# Patient Record
Sex: Male | Born: 1945 | Race: White | Hispanic: No | Marital: Married | Smoking: Current every day smoker
Health system: Southern US, Community
[De-identification: ages and names within clinical notes are randomized; demographics above are authoritative.]

## PROBLEM LIST (undated history)

## (undated) DIAGNOSIS — I739 Peripheral vascular disease, unspecified: Secondary | ICD-10-CM

## (undated) DIAGNOSIS — G35 Multiple sclerosis: Secondary | ICD-10-CM

## (undated) HISTORY — DX: Peripheral vascular disease, unspecified: I73.9

## (undated) HISTORY — PX: TONSILLECTOMY: SUR1361

## (undated) HISTORY — DX: Multiple sclerosis: G35

## (undated) HISTORY — PX: FEMORAL ARTERY - FEMORAL ARTERY BYPASS GRAFT: SUR179

---

## 1982-12-12 HISTORY — PX: LAMINECTOMY: SHX219

## 1983-12-13 DIAGNOSIS — G35 Multiple sclerosis: Secondary | ICD-10-CM

## 1983-12-13 HISTORY — DX: Multiple sclerosis: G35

## 2006-03-14 ENCOUNTER — Encounter: Payer: Self-pay | Admitting: Orthopedic Surgery

## 2006-04-11 ENCOUNTER — Encounter: Payer: Self-pay | Admitting: Orthopedic Surgery

## 2012-05-03 ENCOUNTER — Encounter: Payer: Self-pay | Admitting: Internal Medicine

## 2012-05-03 ENCOUNTER — Ambulatory Visit (INDEPENDENT_AMBULATORY_CARE_PROVIDER_SITE_OTHER): Payer: Medicare Other | Admitting: Internal Medicine

## 2012-05-03 VITALS — BP 116/76 | HR 91 | Temp 97.6°F | Resp 16 | Ht 67.0 in | Wt 215.0 lb

## 2012-05-03 DIAGNOSIS — E119 Type 2 diabetes mellitus without complications: Secondary | ICD-10-CM

## 2012-05-03 DIAGNOSIS — L89509 Pressure ulcer of unspecified ankle, unspecified stage: Secondary | ICD-10-CM

## 2012-05-03 DIAGNOSIS — G35D Multiple sclerosis, unspecified: Secondary | ICD-10-CM

## 2012-05-03 DIAGNOSIS — L8990 Pressure ulcer of unspecified site, unspecified stage: Secondary | ICD-10-CM

## 2012-05-03 DIAGNOSIS — I739 Peripheral vascular disease, unspecified: Secondary | ICD-10-CM | POA: Insufficient documentation

## 2012-05-03 DIAGNOSIS — G35 Multiple sclerosis: Secondary | ICD-10-CM | POA: Insufficient documentation

## 2012-05-03 DIAGNOSIS — E1169 Type 2 diabetes mellitus with other specified complication: Secondary | ICD-10-CM | POA: Insufficient documentation

## 2012-05-03 DIAGNOSIS — D649 Anemia, unspecified: Secondary | ICD-10-CM

## 2012-05-03 DIAGNOSIS — I779 Disorder of arteries and arterioles, unspecified: Secondary | ICD-10-CM

## 2012-05-03 NOTE — Assessment & Plan Note (Signed)
Blood sugars appear to be well-controlled with last A1c of 7%. Will repeat A1c here. Will get records on previous evaluation and management. Encouraged him to be compliant with his insulin. Followup 3 months.

## 2012-05-03 NOTE — Assessment & Plan Note (Signed)
History of fem-fem bypass. Pedal pulses are not palpable on the left. Distal lower extremity is cool to touch. Patient reports this is chronic. Patient has vascular evaluation scheduled for next week.

## 2012-05-03 NOTE — Assessment & Plan Note (Signed)
Secondary to PAT, diabetes. As above, patient has vascular evaluation set up for next week. We will also set up evaluation at local wound healing Center.

## 2012-05-03 NOTE — Assessment & Plan Note (Signed)
Significant limitations in mobility secondary to MS and right shoulder injury. Will get records on previous evaluation and management.

## 2012-05-03 NOTE — Assessment & Plan Note (Signed)
Unable to visualize today because patient needs max assistance to get out of the wheelchair and no lift available. Will set up evaluation at the wound healing Center. We discussed that it will be difficult for his wound to heal given that he is unable to rotate and spends the majority of his time lying on his back.

## 2012-05-03 NOTE — Progress Notes (Signed)
Subjective:    Patient ID: Shane [name], male    DOB: 1946-06-05, 66 y.o.   MRN: [id number]  HPI 66 year old male with history of multiple sclerosis, peripheral vascular disease, diabetes presents to establish care. His primary concern today are 2 wounds, one on his back and one on his left lateral ankle. He reports significant decrease in mobility after a right shoulder injury. He is now bound to a wheelchair and requires assistance for all activities of daily living. Because of this, and because he is unable to spend time lying on his side or stomach because of a shoulder injury, he has developed a decubitus ulcer and a pressure sore on his left heel. He is in the process of being evaluated by vascular surgery to ensure that vascular supply to his left lower extremity is intact. He is status post fem-fem bypass. In regards to his diabetes, his wife reports slight increase in his A1c recently 7%. He is not always compliant with his blood sugar monitoring and insulin use. However, he finds the regimen with Novolin 7030 most ideal for him. He has use Lantus in the past with poor control of his blood sugars.  He is followed at the [facility] by a primary care physician every 6 months and also at [facility] by neurology and vascular surgery.  Outpatient Encounter Prescriptions as of 05/03/2012  Medication Sig Dispense Refill  . amantadine (SYMMETREL) 100 MG capsule Take 100 mg by mouth 2 (two) times daily.      Marland Kitchen amLODipine (NORVASC) 10 MG tablet Take 10 mg by mouth daily.      . ASPIRIN PO Take by mouth daily.      . baclofen (LIORESAL) 10 MG tablet Take 20 mg by mouth 3 (three) times daily.      . cloNIDine (CATAPRES) 0.1 MG tablet Take 0.1 mg by mouth 2 (two) times daily.      . diazepam (VALIUM) 5 MG tablet Take 5 mg by mouth 2 (two) times daily.      Marland Kitchen gabapentin (NEURONTIN) 300 MG capsule Take 300 mg by mouth 4 (four) times daily.      . hydrochlorothiazide (MICROZIDE) 12.5 MG capsule  Take 12.5 mg by mouth daily.      . insulin NPH-insulin regular (NOVOLIN 70/30) (70-30) 100 UNIT/ML injection Inject into the skin as directed. Inject subcutaneously 18 units every morning & 20 units every night.      Marland Kitchen lisinopril (PRINIVIL,ZESTRIL) 10 MG tablet Take 10 mg by mouth daily.      . metFORMIN (GLUCOPHAGE) 1000 MG tablet Take 1,000 mg by mouth 2 (two) times daily with a meal.      . methotrexate (RHEUMATREX) 2.5 MG tablet Take 7.5 mg by mouth once a week.      . Multiple Vitamin (MULTIVITAMIN) tablet Take 1 tablet by mouth daily.      . naproxen (NAPROSYN) 250 MG tablet Take 250 mg by mouth 2 (two) times daily with a meal.      . omeprazole (PRILOSEC) 20 MG capsule Take 20 mg by mouth daily.      . pentoxifylline (TRENTAL) 400 MG CR tablet Take 400 mg by mouth 2 (two) times daily.      . pravastatin (PRAVACHOL) 80 MG tablet Take 40 mg by mouth daily.        Review of Systems  Constitutional: Negative for fever, chills, activity change, appetite change, fatigue and unexpected weight change.  Eyes: Negative for visual disturbance.  Respiratory: Negative for cough and shortness of breath.   Cardiovascular: Negative for chest pain, palpitations and leg swelling.  Gastrointestinal: Positive for constipation (takes immodium to regulate BM). Negative for abdominal pain, blood in stool, abdominal distention, anal bleeding and rectal pain.  Genitourinary: Negative for dysuria, urgency and difficulty urinating.  Musculoskeletal: Positive for myalgias, joint swelling, arthralgias and gait problem.  Skin: Positive for color change and wound. Negative for rash.  Neurological: Positive for weakness and numbness.  Hematological: Negative for adenopathy.  Psychiatric/Behavioral: Negative for sleep disturbance and dysphoric mood. The patient is not nervous/anxious.    BP 116/76  Pulse 91  Temp(Src) 97.6 F (36.4 C) (Oral)  Resp 16  Ht 5\' 7"  (1.702 m)  Wt 215 lb (97.523 kg)  BMI 33.67 kg/m2   SpO2 97%     Objective:   Physical Exam  Constitutional: He is oriented to person, place, and time. He appears well-developed and well-nourished. No distress.  HENT:  Head: Normocephalic and atraumatic.  Right Ear: External ear normal.  Left Ear: External ear normal.  Nose: Nose normal.  Mouth/Throat: Oropharynx is clear and moist. No oropharyngeal exudate.  Eyes: Conjunctivae and EOM are normal. Pupils are equal, round, and reactive to light. Right eye exhibits no discharge. Left eye exhibits no discharge. No scleral icterus.  Neck: Normal range of motion. Neck supple. No tracheal deviation present. No thyromegaly present.  Cardiovascular: Normal rate, regular rhythm and normal heart sounds.  Exam reveals no gallop and no friction rub.   No murmur heard. Pulmonary/Chest: Effort normal and breath sounds normal. No accessory muscle usage. Not tachypneic. No respiratory distress. He has no decreased breath sounds. He has no wheezes. He has no rhonchi. He has no rales. He exhibits no tenderness.  Musculoskeletal: He exhibits edema.       Right shoulder: He exhibits decreased range of motion, deformity and decreased strength. He exhibits no tenderness, no bony tenderness, no swelling and no pain.  Lymphadenopathy:    He has no cervical adenopathy.  Neurological: He is alert and oriented to person, place, and time. No cranial nerve deficit. He exhibits abnormal muscle tone. Coordination abnormal.  Skin: Skin is warm and dry. Lesion noted. No rash noted. He is not diaphoretic. There is erythema. No pallor.     Psychiatric: He has a normal mood and affect. His behavior is normal. Judgment and thought content normal.          Assessment & Plan:

## 2012-05-11 ENCOUNTER — Encounter: Payer: Self-pay | Admitting: Nurse Practitioner

## 2012-05-11 ENCOUNTER — Encounter: Payer: Self-pay | Admitting: Cardiothoracic Surgery

## 2012-05-22 ENCOUNTER — Other Ambulatory Visit (INDEPENDENT_AMBULATORY_CARE_PROVIDER_SITE_OTHER): Payer: Medicare Other | Admitting: *Deleted

## 2012-05-22 DIAGNOSIS — E119 Type 2 diabetes mellitus without complications: Secondary | ICD-10-CM

## 2012-05-22 DIAGNOSIS — N390 Urinary tract infection, site not specified: Secondary | ICD-10-CM

## 2012-05-22 DIAGNOSIS — D649 Anemia, unspecified: Secondary | ICD-10-CM

## 2012-05-23 ENCOUNTER — Telehealth: Payer: Self-pay | Admitting: Internal Medicine

## 2012-05-23 LAB — CBC WITH DIFFERENTIAL/PLATELET
Basophils Absolute: 0 10*3/uL (ref 0.0–0.1)
Eosinophils Absolute: 0.5 10*3/uL (ref 0.0–0.7)
Hemoglobin: 15.6 g/dL (ref 13.0–17.0)
Lymphocytes Relative: 9.7 % — ABNORMAL LOW (ref 12.0–46.0)
MCHC: 32.3 g/dL (ref 30.0–36.0)
Monocytes Absolute: 0.7 10*3/uL (ref 0.1–1.0)
Neutro Abs: 9.6 10*3/uL — ABNORMAL HIGH (ref 1.4–7.7)
RDW: 18.2 % — ABNORMAL HIGH (ref 11.5–14.6)

## 2012-05-23 LAB — POCT URINALYSIS DIPSTICK
Bilirubin, UA: NEGATIVE
Glucose, UA: NEGATIVE
Ketones, UA: NEGATIVE
pH, UA: 6

## 2012-05-23 LAB — COMPREHENSIVE METABOLIC PANEL
ALT: 21 U/L (ref 0–53)
AST: 26 U/L (ref 0–37)
Albumin: 4.1 g/dL (ref 3.5–5.2)
Calcium: 9.3 mg/dL (ref 8.4–10.5)
Chloride: 104 mEq/L (ref 96–112)
Creatinine, Ser: 1.1 mg/dL (ref 0.4–1.5)
Potassium: 4.1 mEq/L (ref 3.5–5.1)

## 2012-05-23 LAB — LIPID PANEL: HDL: 35.3 mg/dL — ABNORMAL LOW (ref >39.00)

## 2012-05-23 LAB — HEMOGLOBIN A1C: Hgb A1c MFr Bld: 7.3 % — ABNORMAL HIGH (ref 4.6–6.5)

## 2012-05-23 NOTE — Addendum Note (Signed)
Addended by: Jobie Quaker on: 05/23/2012 05:05 PM   Modules accepted: Orders

## 2012-05-23 NOTE — Telephone Encounter (Signed)
Caller: Linda/Spouse; PCP: Ronna Polio; CB#: [phone];  Call regarding Patient Is Wheelchair Bound and Has Cloudy Smelly Urine Can She Bring in A. Sample.; onset of cloudy urine 05/22/12.  Afebrile.  Denies abdominal or flank pain.  Per protocol, emergent symptoms denied; per office staff, may come by for specimen kit and drop off specimen.  Caller advised.

## 2012-05-24 ENCOUNTER — Other Ambulatory Visit: Payer: Self-pay | Admitting: *Deleted

## 2012-05-24 MED ORDER — CIPROFLOXACIN HCL 500 MG PO TABS: 500.0000 mg | ORAL_TABLET | Freq: Two times a day (BID) | ORAL | Status: AC

## 2012-05-27 LAB — URINE CULTURE: Colony Count: >=100000

## 2012-05-31 ENCOUNTER — Telehealth: Payer: Self-pay | Admitting: Internal Medicine

## 2012-05-31 NOTE — Telephone Encounter (Signed)
He needs to go to ED or urgent care today, as it is 4:40pm and we have no more visits.

## 2012-05-31 NOTE — Telephone Encounter (Signed)
Patient was advised. He stated that he will go.

## 2012-05-31 NOTE — Telephone Encounter (Signed)
PLEASE CALL Shane [name] AT [phone] Caller: Shane [name]; PCP: Shane [name]; CB#: [phone]; ; ; Call regarding Patient Treated for an UTI Last Week, Now His Feet and Ankles Are Swollen, Also Hard Time Urinating;   Shane [name]/Wife calling on emergent line and states Shane [name] is having trouble urinating. States a urine specimen was sent to office last week and he was diagnosed with a UTI. Was not seen in office. Urine was very cloudy and had foul odor.  Is currently on day 7 of 10 days of Cipro BID. Last voided @ 0830 on 05/31/12. Urine less cloudy and no odor on 05/31/12.  Shane [name] states Shane [name] is wheelchair bound. States he has edema in feet and legs that has worsened over last 24 hrs. "Worst edema she has seen." Shane [name] has been in bed all day with feet elevated with no improvement of edema. Shane [name] suggesting Lasix be called in to [facility]- [phone]. Per edema atraumatic protocol has see provider within 24 hrs disposition. Requesting callback @  [phone].

## 2012-06-01 ENCOUNTER — Encounter: Payer: Self-pay | Admitting: Internal Medicine

## 2012-06-01 ENCOUNTER — Telehealth: Payer: Self-pay | Admitting: Internal Medicine

## 2012-06-01 ENCOUNTER — Ambulatory Visit (INDEPENDENT_AMBULATORY_CARE_PROVIDER_SITE_OTHER): Payer: Medicare Other | Admitting: Internal Medicine

## 2012-06-01 VITALS — BP 140/90 | HR 108 | Temp 97.7°F | Ht 67.0 in

## 2012-06-01 DIAGNOSIS — R0600 Dyspnea, unspecified: Secondary | ICD-10-CM

## 2012-06-01 DIAGNOSIS — I509 Heart failure, unspecified: Secondary | ICD-10-CM

## 2012-06-01 DIAGNOSIS — R609 Edema, unspecified: Secondary | ICD-10-CM

## 2012-06-01 DIAGNOSIS — H109 Unspecified conjunctivitis: Secondary | ICD-10-CM | POA: Insufficient documentation

## 2012-06-01 DIAGNOSIS — R0609 Other forms of dyspnea: Secondary | ICD-10-CM

## 2012-06-01 LAB — COMPREHENSIVE METABOLIC PANEL
ALT: 29 U/L (ref 0–53)
Alkaline Phosphatase: 101 U/L (ref 39–117)
Creatinine, Ser: 0.7 mg/dL (ref 0.4–1.5)
Sodium: 140 mEq/L (ref 135–145)
Total Bilirubin: 0.4 mg/dL (ref 0.3–1.2)
Total Protein: 7 g/dL (ref 6.0–8.3)

## 2012-06-01 LAB — BRAIN NATRIURETIC PEPTIDE: Pro B Natriuretic peptide (BNP): 63 pg/mL (ref 0.0–100.0)

## 2012-06-01 MED ORDER — POLYMYXIN B-TRIMETHOPRIM 10000-0.1 UNIT/ML-% OP SOLN: 1.0000 [drp] | OPHTHALMIC | Status: AC

## 2012-06-01 MED ORDER — FUROSEMIDE 20 MG PO TABS: 20.0000 mg | ORAL_TABLET | Freq: Two times a day (BID) | ORAL | Status: DC

## 2012-06-01 NOTE — Assessment & Plan Note (Addendum)
Bilateral lower extremity edema likely secondary to chronic venous insufficiency. Only mildly increased compared to previous exam. Chronic dependent edema. No calf tenderness or changes to suggest DVT.Marland Kitchen Renal function on labs today was stable. No evidence of congestive heart failure. Will use gentle diuresis with Lasix over the next 3 days. Patient will have repeat BMP on Monday. He will followup for visit in one week.

## 2012-06-01 NOTE — Assessment & Plan Note (Signed)
Symptoms consistent with early bacterial conjunctivitis the left eye. Will treat with Polytrim. Patient will call if symptoms are not improving.

## 2012-06-01 NOTE — Patient Instructions (Signed)
Start Lasix today. Take twice daily on Saturday and Sunday.  Return to clinic to check labwork on Monday.   Follow up 1 week.

## 2012-06-01 NOTE — Telephone Encounter (Signed)
Entered in error

## 2012-06-01 NOTE — Progress Notes (Signed)
Subjective:    Patient ID: Shane [name], male    DOB: 1946-06-11, 66 y.o.   MRN: [id number]  HPI 66 YO male with history of multiple sclerosis and recent episode of urinary tract infection presents for acute visit complaining of bilateral lower extremity edema x 1 week. He has had mild lower extremity edema in the past but reports that this episode seems significantly worse. Edema is symmetric, affecting both legs up to his knees. He has mild discomfort in both of his lower extremities. He denies any fever, chills, chest pain, palpitations.  Outpatient Encounter Prescriptions as of 06/01/2012  Medication Sig Dispense Refill  . amantadine (SYMMETREL) 100 MG capsule Take 100 mg by mouth 2 (two) times daily.      Marland Kitchen amLODipine (NORVASC) 10 MG tablet Take 10 mg by mouth daily.      . ASPIRIN PO Take by mouth daily.      . baclofen (LIORESAL) 10 MG tablet Take 20 mg by mouth 3 (three) times daily.      . ciprofloxacin (CIPRO) 500 MG tablet Take 1 tablet (500 mg total) by mouth 2 (two) times daily.  28 tablet  0  . cloNIDine (CATAPRES) 0.1 MG tablet Take 0.1 mg by mouth 2 (two) times daily.      . diazepam (VALIUM) 5 MG tablet Take 5 mg by mouth 2 (two) times daily.      Marland Kitchen gabapentin (NEURONTIN) 300 MG capsule Take 300 mg by mouth 4 (four) times daily.      . hydrochlorothiazide (MICROZIDE) 12.5 MG capsule Take 12.5 mg by mouth daily.      . insulin NPH-insulin regular (NOVOLIN 70/30) (70-30) 100 UNIT/ML injection Inject into the skin as directed. Inject subcutaneously 18 units every morning & 20 units every night.      Marland Kitchen lisinopril (PRINIVIL,ZESTRIL) 10 MG tablet Take 10 mg by mouth daily.      . metFORMIN (GLUCOPHAGE) 1000 MG tablet Take 1,000 mg by mouth 2 (two) times daily with a meal.      . methotrexate (RHEUMATREX) 2.5 MG tablet Take 7.5 mg by mouth once a week.      . Multiple Vitamin (MULTIVITAMIN) tablet Take 1 tablet by mouth daily.      . naproxen (NAPROSYN) 250 MG tablet Take 250 mg by  mouth 2 (two) times daily with a meal.      . omeprazole (PRILOSEC) 20 MG capsule Take 20 mg by mouth daily.      . pentoxifylline (TRENTAL) 400 MG CR tablet Take 400 mg by mouth 2 (two) times daily.      . pravastatin (PRAVACHOL) 80 MG tablet Take 40 mg by mouth daily.      . furosemide (LASIX) 20 MG tablet Take 1 tablet (20 mg total) by mouth 2 (two) times daily.  60 tablet  3  . trimethoprim-polymyxin b (POLYTRIM) ophthalmic solution Place 1 drop into the left eye every 4 (four) hours.  10 mL  0   BP 140/90  Pulse 108  Temp 97.7 F (36.5 C) (Oral)  Ht 5\' 7"  (1.702 m)  SpO2 96%  Review of Systems  Constitutional: Negative for fever, chills, activity change, appetite change, fatigue and unexpected weight change.  Eyes: Negative for visual disturbance.  Respiratory: Negative for cough and shortness of breath.   Cardiovascular: Positive for leg swelling. Negative for chest pain and palpitations.  Gastrointestinal: Negative for abdominal pain and abdominal distention.  Genitourinary: Negative for dysuria, urgency and difficulty urinating.  Musculoskeletal: Negative for arthralgias and gait problem.  Skin: Negative for color change and rash.  Hematological: Negative for adenopathy.  Psychiatric/Behavioral: Negative for disturbed wake/sleep cycle and dysphoric mood. The patient is not nervous/anxious.        Objective:   Physical Exam  Constitutional: He is oriented to person, place, and time. He appears well-developed and well-nourished. No distress.  HENT:  Head: Normocephalic and atraumatic.  Right Ear: External ear normal.  Left Ear: External ear normal.  Nose: Nose normal.  Mouth/Throat: Oropharynx is clear and moist. No oropharyngeal exudate.  Eyes: Conjunctivae and EOM are normal. Pupils are equal, round, and reactive to light. Right eye exhibits no discharge. Left eye exhibits no discharge. No scleral icterus.  Neck: Normal range of motion. Neck supple. No tracheal deviation  present. No thyromegaly present.  Cardiovascular: Normal rate, regular rhythm and normal heart sounds.  Exam reveals no gallop and no friction rub.   No murmur heard. Pulmonary/Chest: Effort normal and breath sounds normal. No respiratory distress. He has no wheezes. He has no rales. He exhibits no tenderness.  Musculoskeletal: Normal range of motion. He exhibits edema (pitting to bilateral knees).  Lymphadenopathy:    He has no cervical adenopathy.  Neurological: He is alert and oriented to person, place, and time. No cranial nerve deficit.  Skin: Skin is warm and dry. No rash noted. He is not diaphoretic. No erythema. No pallor.  Psychiatric: He has a normal mood and affect. His behavior is normal. Judgment and thought content normal.          Assessment & Plan:

## 2012-06-04 ENCOUNTER — Other Ambulatory Visit: Payer: Medicare Other

## 2012-06-05 ENCOUNTER — Other Ambulatory Visit (INDEPENDENT_AMBULATORY_CARE_PROVIDER_SITE_OTHER): Payer: Medicare Other | Admitting: *Deleted

## 2012-06-05 DIAGNOSIS — Z79899 Other long term (current) drug therapy: Secondary | ICD-10-CM

## 2012-06-06 LAB — BASIC METABOLIC PANEL
Calcium: 9.9 mg/dL (ref 8.4–10.5)
GFR: 62.47 mL/min (ref >60.00)
Glucose, Bld: 189 mg/dL — ABNORMAL HIGH (ref 70–99)
Sodium: 145 mEq/L (ref 135–145)

## 2012-06-07 ENCOUNTER — Ambulatory Visit (INDEPENDENT_AMBULATORY_CARE_PROVIDER_SITE_OTHER): Payer: Medicare Other | Admitting: Internal Medicine

## 2012-06-07 ENCOUNTER — Encounter: Payer: Self-pay | Admitting: Internal Medicine

## 2012-06-07 VITALS — BP 150/90 | HR 100 | Temp 98.6°F | Ht 67.0 in

## 2012-06-07 DIAGNOSIS — N390 Urinary tract infection, site not specified: Secondary | ICD-10-CM | POA: Insufficient documentation

## 2012-06-07 DIAGNOSIS — E1165 Type 2 diabetes mellitus with hyperglycemia: Secondary | ICD-10-CM

## 2012-06-07 DIAGNOSIS — Z23 Encounter for immunization: Secondary | ICD-10-CM

## 2012-06-07 DIAGNOSIS — R609 Edema, unspecified: Secondary | ICD-10-CM

## 2012-06-07 NOTE — Assessment & Plan Note (Signed)
Encouraged better compliance with diet. If sugars persistently elevated greater than 250, would favor increasing dose of Novolin 70/30 to 20 units twice daily. He will e-mail or call with blood sugars. Followup in 3 months or sooner if needed.

## 2012-06-07 NOTE — Assessment & Plan Note (Signed)
Symptomatically improved. Patient has completed antibiotics. Will plan to be send urinalysis and culture to ensure clearance of infection, given that he is high risk on immunosuppressants and with diabetes.

## 2012-06-07 NOTE — Progress Notes (Signed)
Subjective:    Patient ID: Shane [name], male    DOB: Dec 14, 1945, 66 y.o.   MRN: [id number]  HPI 66 year old male with history of multiple sclerosis, hypertension, diabetes, hyperlipidemia presents for followup after recent urinary tract infection and worsening lower extremity edema. In regards to his urinary tract infection, he reports symptoms are resolved. He took his last dose of antibiotics today. He denies any fever, chills, dysuria, flank pain.  In regards to his recent worsening lower extremity edema, he reports some improvement with use of Lasix. He has been taking Lasix 20 mg twice daily. He took this for 3 days and then stopped. He reports edema is back to baseline. He denies shortness of breath. Recent lab work showed stable electrolytes and renal function.  In regards to diabetes, he notes some recent dietary indiscretion and elevated blood sugars as high as 350. He reports full compliance with his insulin. His last hemoglobin A1c was 7.4%. He has not had any low blood sugars.  Outpatient Encounter Prescriptions as of 06/07/2012  Medication Sig Dispense Refill  . amantadine (SYMMETREL) 100 MG capsule Take 100 mg by mouth 2 (two) times daily.      Marland Kitchen amLODipine (NORVASC) 10 MG tablet Take 10 mg by mouth daily.      . ASPIRIN PO Take by mouth daily.      . baclofen (LIORESAL) 10 MG tablet Take 20 mg by mouth 3 (three) times daily.      . cloNIDine (CATAPRES) 0.1 MG tablet Take 0.1 mg by mouth 2 (two) times daily.      . diazepam (VALIUM) 5 MG tablet Take 5 mg by mouth 2 (two) times daily.      . furosemide (LASIX) 20 MG tablet Take 1 tablet (20 mg total) by mouth 2 (two) times daily.  60 tablet  3  . gabapentin (NEURONTIN) 300 MG capsule Take 300 mg by mouth 4 (four) times daily.      . hydrochlorothiazide (MICROZIDE) 12.5 MG capsule Take 12.5 mg by mouth daily.      . insulin NPH-insulin regular (NOVOLIN 70/30) (70-30) 100 UNIT/ML injection Inject into the skin as directed. Inject  subcutaneously 18 units every morning & 20 units every night.      Marland Kitchen lisinopril (PRINIVIL,ZESTRIL) 10 MG tablet Take 10 mg by mouth daily.      . metFORMIN (GLUCOPHAGE) 1000 MG tablet Take 1,000 mg by mouth 2 (two) times daily with a meal.      . methotrexate (RHEUMATREX) 2.5 MG tablet Take 7.5 mg by mouth once a week.      . Multiple Vitamin (MULTIVITAMIN) tablet Take 1 tablet by mouth daily.      . naproxen (NAPROSYN) 250 MG tablet Take 250 mg by mouth 2 (two) times daily with a meal.      . omeprazole (PRILOSEC) 20 MG capsule Take 20 mg by mouth daily.      . pentoxifylline (TRENTAL) 400 MG CR tablet Take 400 mg by mouth 2 (two) times daily.      . pravastatin (PRAVACHOL) 80 MG tablet Take 40 mg by mouth daily.      Marland Kitchen trimethoprim-polymyxin b (POLYTRIM) ophthalmic solution Place 1 drop into the left eye every 4 (four) hours.  10 mL  0    Review of Systems  Constitutional: Negative for fever, chills, diaphoresis and fatigue.  Respiratory: Negative for cough and shortness of breath.   Cardiovascular: Positive for leg swelling. Negative for chest pain and palpitations.  Genitourinary:  Negative for dysuria, urgency, frequency and flank pain.       Objective:   Physical Exam  Constitutional: He is oriented to person, place, and time. He appears well-developed and well-nourished. No distress.  HENT:  Head: Normocephalic and atraumatic.  Right Ear: External ear normal.  Left Ear: External ear normal.  Nose: Nose normal.  Mouth/Throat: Oropharynx is clear and moist. No oropharyngeal exudate.  Eyes: Conjunctivae and EOM are normal. Pupils are equal, round, and reactive to light. Right eye exhibits no discharge. Left eye exhibits no discharge. No scleral icterus.  Neck: Normal range of motion. Neck supple. No tracheal deviation present. No thyromegaly present.  Cardiovascular: Normal rate, regular rhythm and normal heart sounds.  Exam reveals no gallop and no friction rub.   No murmur  heard. Pulmonary/Chest: Effort normal and breath sounds normal. No respiratory distress. He has no wheezes. He has no rales. He exhibits no tenderness.  Musculoskeletal: Normal range of motion. He exhibits edema (pitting bilateral ankles).  Lymphadenopathy:    He has no cervical adenopathy.  Neurological: He is alert and oriented to person, place, and time. No cranial nerve deficit. Coordination normal.  Skin: Skin is warm and dry. No rash noted. He is not diaphoretic. No erythema. No pallor.  Psychiatric: He has a normal mood and affect. His behavior is normal. Judgment and thought content normal.          Assessment & Plan:

## 2012-06-07 NOTE — Assessment & Plan Note (Signed)
Edema has improved to baseline. Will plan to continue to monitor and use Lasix as needed for significant worsening of edema. We also discussed keeping legs elevated and limiting salt intake. Followup 3 months or sooner as needed.

## 2012-06-08 LAB — POCT URINALYSIS DIPSTICK
Blood, UA: NEGATIVE
Protein, UA: 30
Spec Grav, UA: 1.025
Urobilinogen, UA: 0.2

## 2012-08-08 ENCOUNTER — Ambulatory Visit: Payer: Medicare Other | Admitting: Internal Medicine

## 2012-08-23 ENCOUNTER — Other Ambulatory Visit: Payer: Self-pay | Admitting: Internal Medicine

## 2012-08-23 MED ORDER — METHOTREXATE (ANTI-RHEUMATIC) 2.5 MG PO TABS: 7.5000 mg | ORAL_TABLET | ORAL | Status: AC

## 2012-09-10 ENCOUNTER — Ambulatory Visit: Payer: Self-pay | Admitting: Vascular Surgery

## 2012-09-10 LAB — BASIC METABOLIC PANEL
Anion Gap: 9 (ref 7–16)
Calcium, Total: 9.3 mg/dL (ref 8.5–10.1)
Chloride: 104 mmol/L (ref 98–107)
Co2: 28 mmol/L (ref 21–32)
Creatinine: 0.95 mg/dL (ref 0.60–1.30)
Sodium: 141 mmol/L (ref 136–145)

## 2012-09-11 LAB — CBC WITH DIFFERENTIAL/PLATELET
Basophil #: 0.1 10*3/uL (ref 0.0–0.1)
Eosinophil #: 0.4 10*3/uL (ref 0.0–0.7)
Eosinophil %: 2.8 %
HCT: 43.8 % (ref 40.0–52.0)
Lymphocyte #: 1.5 10*3/uL (ref 1.0–3.6)
MCH: 30.7 pg (ref 26.0–34.0)
MCHC: 32.8 g/dL (ref 32.0–36.0)
MCV: 94 fL (ref 80–100)
Monocyte #: 1 x10 3/mm (ref 0.2–1.0)
Monocyte %: 7.3 %
RDW: 18 % — ABNORMAL HIGH (ref 11.5–14.5)

## 2012-09-11 LAB — BASIC METABOLIC PANEL
Calcium, Total: 8.8 mg/dL (ref 8.5–10.1)
Co2: 24 mmol/L (ref 21–32)
EGFR (African American): >60
Sodium: 141 mmol/L (ref 136–145)

## 2012-09-27 ENCOUNTER — Encounter: Payer: Self-pay | Admitting: Internal Medicine

## 2012-09-27 ENCOUNTER — Ambulatory Visit (INDEPENDENT_AMBULATORY_CARE_PROVIDER_SITE_OTHER): Payer: Medicare Other | Admitting: Internal Medicine

## 2012-09-27 VITALS — BP 132/72 | HR 100 | Temp 98.5°F | Ht 67.0 in

## 2012-09-27 DIAGNOSIS — I775 Necrosis of artery: Secondary | ICD-10-CM | POA: Insufficient documentation

## 2012-09-27 NOTE — Progress Notes (Signed)
Subjective:    Patient ID: Shane [name], male    DOB: 06-14-1946, 66 y.o.   MRN: [id number]  HPI 66 year old male with history of peripheral vascular disease status post angioplasty of the right femoral artery on 09/10/2012 presents for acute visit complaining of skin lesions over his legs and abdomen. Patient reports that shortly after his angioplasty procedure he developed dark purple and erythematous painful lesions on his feet and legs. He then developed a large lesion in his left mid abdomen. He returned to his vascular surgeon who felt the lesions are most consistent with arteriothrombosis. His surgeon started him on Plavix. However, distribution of lesions was confusing because most of the lesions were on his left leg rather than right leg where the procedure was performed. He reports that over the course of the last 2 weeks he has developed worsening lesions with open ulceration and black discoloration of his left great toe. The lesion on his anterior left midabdomen has gotten larger and is extremely tender to palpation. He has a large central black eschar. Ultimately his wife made referrals to wound center in [facility] for further care. His wife reports that the vascular surgeon recommended possibly getting a CT angiogram to evaluate the aorta but opted to leave this to primary care.  Outpatient Encounter Prescriptions as of 09/27/2012  Medication Sig Dispense Refill  . amantadine (SYMMETREL) 100 MG capsule Take 100 mg by mouth 2 (two) times daily.      Marland Kitchen amLODipine (NORVASC) 10 MG tablet Take 10 mg by mouth daily.      . ASPIRIN PO Take by mouth daily.      . baclofen (LIORESAL) 10 MG tablet Take 20 mg by mouth 3 (three) times daily.      . cloNIDine (CATAPRES) 0.1 MG tablet Take 0.1 mg by mouth 2 (two) times daily.      . clopidogrel (PLAVIX) 75 MG tablet Take 75 mg by mouth daily.      . diazepam (VALIUM) 5 MG tablet Take 5 mg by mouth 2 (two) times daily.      . furosemide (LASIX) 20 MG  tablet Take 1 tablet (20 mg total) by mouth 2 (two) times daily.  60 tablet  3  . gabapentin (NEURONTIN) 300 MG capsule Take 300 mg by mouth 4 (four) times daily.      . hydrochlorothiazide (MICROZIDE) 12.5 MG capsule Take 12.5 mg by mouth daily.      . insulin NPH-insulin regular (NOVOLIN 70/30) (70-30) 100 UNIT/ML injection Inject into the skin as directed. Inject subcutaneously 18 units every morning & 20 units every night.      Marland Kitchen lisinopril (PRINIVIL,ZESTRIL) 10 MG tablet Take 10 mg by mouth daily.      . metFORMIN (GLUCOPHAGE) 1000 MG tablet Take 1,000 mg by mouth 2 (two) times daily with a meal.      . methotrexate (RHEUMATREX) 2.5 MG tablet Take 3 tablets (7.5 mg total) by mouth once a week.  39 tablet  3  . Multiple Vitamin (MULTIVITAMIN) tablet Take 1 tablet by mouth daily.      . naproxen (NAPROSYN) 250 MG tablet Take 250 mg by mouth 2 (two) times daily with a meal.      . omeprazole (PRILOSEC) 20 MG capsule Take 20 mg by mouth daily.      . pentoxifylline (TRENTAL) 400 MG CR tablet Take 400 mg by mouth 2 (two) times daily.      . pravastatin (PRAVACHOL) 80 MG tablet Take 40  mg by mouth daily.        Review of Systems  Constitutional: Negative for fever, chills, activity change, appetite change, fatigue and unexpected weight change.  Eyes: Negative for visual disturbance.  Respiratory: Negative for cough and shortness of breath.   Cardiovascular: Negative for chest pain, palpitations and leg swelling.  Gastrointestinal: Negative for abdominal pain and abdominal distention.  Genitourinary: Negative for dysuria, urgency and difficulty urinating.  Musculoskeletal: Negative for arthralgias and gait problem.  Skin: Positive for color change, rash and wound.  Hematological: Negative for adenopathy.  Psychiatric/Behavioral: Negative for disturbed wake/sleep cycle and dysphoric mood. The patient is not nervous/anxious.        Objective:   Physical Exam  Constitutional: He is oriented  to person, place, and time. He appears well-developed and well-nourished. No distress.  HENT:  Head: Normocephalic and atraumatic.  Right Ear: External ear normal.  Left Ear: External ear normal.  Nose: Nose normal.  Mouth/Throat: Oropharynx is clear and moist. No oropharyngeal exudate.  Eyes: Conjunctivae normal and EOM are normal. Pupils are equal, round, and reactive to light. Right eye exhibits no discharge. Left eye exhibits no discharge. No scleral icterus.  Neck: Normal range of motion. Neck supple. No tracheal deviation present. No thyromegaly present.  Cardiovascular: Normal rate, regular rhythm and normal heart sounds.  Exam reveals no gallop and no friction rub.   No murmur heard. Pulmonary/Chest: Effort normal and breath sounds normal. No respiratory distress. He has no wheezes. He has no rales. He exhibits no tenderness.  Musculoskeletal: Normal range of motion. He exhibits no edema.  Lymphadenopathy:    He has no cervical adenopathy.  Neurological: He is alert and oriented to person, place, and time. No cranial nerve deficit. Coordination normal.  Skin: Skin is warm and dry. Lesion and rash noted. He is not diaphoretic. There is erythema. No pallor.     Psychiatric: He has a normal mood and affect. His behavior is normal. Judgment and thought content normal.          Assessment & Plan:

## 2012-09-27 NOTE — Assessment & Plan Note (Signed)
Reviewed notes from vascular surgeon today. 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 Question if he may have plaque within the abdominal aorta which led to showering of microemboli during his recent vascular procedure. Prolonged discussion with patient and his wife today. Ultimately, called and consulted with vascular surgeon on call at [facility]. He recommended scheduling CT angiogram to evaluate the aorta as an outpatient with followup with vascular surgery at [facility]. However, after further discussion with patient and his wife, patient feels he is in significant pain with the lesions and he is concerned about the potential loss of blood flow in his left great toe leading to need for amputation. He has a scheduled appointment with vascular surgery at [facility] in 2 weeks, but would prefer to have urgent evaluation. I agree with his concerns and need for more urgent evaluation. He will go to the [facility] ER for further evaluation including vascular eval. Also question if he may have underlying prothrombotic state which contributed to symptoms. He may ultimately need hematology consultation as an outpatient.

## 2012-10-01 ENCOUNTER — Encounter (HOSPITAL_BASED_OUTPATIENT_CLINIC_OR_DEPARTMENT_OTHER): Payer: Medicare Other

## 2012-10-01 ENCOUNTER — Ambulatory Visit: Payer: Medicare Other | Admitting: Internal Medicine

## 2012-10-03 ENCOUNTER — Encounter: Payer: Self-pay | Admitting: Cardiothoracic Surgery

## 2012-10-03 ENCOUNTER — Encounter: Payer: Self-pay | Admitting: Nurse Practitioner

## 2012-10-10 ENCOUNTER — Ambulatory Visit (INDEPENDENT_AMBULATORY_CARE_PROVIDER_SITE_OTHER): Payer: Medicare Other | Admitting: Internal Medicine

## 2012-10-10 VITALS — BP 132/72 | HR 96 | Temp 97.6°F

## 2012-10-10 DIAGNOSIS — E669 Obesity, unspecified: Secondary | ICD-10-CM

## 2012-10-10 DIAGNOSIS — E119 Type 2 diabetes mellitus without complications: Secondary | ICD-10-CM

## 2012-10-10 DIAGNOSIS — I749 Embolism and thrombosis of unspecified artery: Secondary | ICD-10-CM

## 2012-10-10 DIAGNOSIS — E1169 Type 2 diabetes mellitus with other specified complication: Secondary | ICD-10-CM

## 2012-10-10 DIAGNOSIS — Z23 Encounter for immunization: Secondary | ICD-10-CM

## 2012-10-10 DIAGNOSIS — E785 Hyperlipidemia, unspecified: Secondary | ICD-10-CM

## 2012-10-10 MED ORDER — AMLODIPINE BESY-BENAZEPRIL HCL 10-20 MG PO CAPS: 1.0000 | ORAL_CAPSULE | Freq: Every day | ORAL | Status: AC

## 2012-10-11 ENCOUNTER — Ambulatory Visit: Payer: Medicare Other | Admitting: Internal Medicine

## 2012-10-11 ENCOUNTER — Encounter: Payer: Self-pay | Admitting: Internal Medicine

## 2012-10-11 DIAGNOSIS — I749 Embolism and thrombosis of unspecified artery: Secondary | ICD-10-CM | POA: Insufficient documentation

## 2012-10-11 DIAGNOSIS — E785 Hyperlipidemia, unspecified: Secondary | ICD-10-CM | POA: Insufficient documentation

## 2012-10-11 NOTE — Assessment & Plan Note (Signed)
Patient reports blood sugars have controlled. Will continue current medications. Will check A1c with next labs in 1 month.

## 2012-10-11 NOTE — Progress Notes (Signed)
Subjective:    Patient ID: Shane [name], male    DOB: Jul 09, 1946, 66 y.o.   MRN: [id number]  HPI 66 year old male with history of multiple sclerosis, diabetes mellitus, peripheral vascular disease status post recent episode of cholesterol embolization syndrome presents for followup after hospitalization at [facility]. 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 He was changed from pravastatin to Lipitor. He has been set up with local wound healing Center for additional treatment possibly to include hyperbaric treatment. He reports that wound pain and size of the wound has improved. He continues to occasionally use Percocet for severe pain. He denies any new concerns today.  Outpatient Encounter Prescriptions as of 10/10/2012  Medication Sig Dispense Refill  . amantadine (SYMMETREL) 100 MG capsule Take 100 mg by mouth 2 (two) times daily.      Marland Kitchen amLODipine-benazepril (LOTREL) 10-20 MG per capsule Take 1 capsule by mouth daily.  30 capsule  0  . ASPIRIN PO Take by mouth daily.      Marland Kitchen atorvastatin (LIPITOR) 40 MG tablet Take 40 mg by mouth daily.      . baclofen (LIORESAL) 10 MG tablet Take 20 mg by mouth 3 (three) times daily.      . cloNIDine (CATAPRES) 0.1 MG tablet Take 0.1 mg by mouth 2 (two) times daily.      . clopidogrel (PLAVIX) 75 MG tablet Take 75 mg by mouth daily.      . diazepam (VALIUM) 5 MG tablet Take 5 mg by mouth 2 (two) times daily.      . fluticasone (FLONASE) 50 MCG/ACT nasal spray Place 2 sprays into the nose daily.      Marland Kitchen gabapentin (NEURONTIN) 300 MG capsule Take 300 mg by mouth 4 (four) times daily.      . insulin NPH-insulin regular (NOVOLIN 70/30) (70-30) 100 UNIT/ML injection Inject into the skin as directed. Inject subcutaneously 18 units every morning & 20 units every night.      . Multiple Vitamin (MULTIVITAMIN) tablet Take 1 tablet by mouth daily.      .  naproxen (NAPROSYN) 250 MG tablet Take 250 mg by mouth 2 (two) times daily with a meal.      . omeprazole (PRILOSEC) 20 MG capsule Take 20 mg by mouth daily.      Marland Kitchen oxyCODONE-acetaminophen (PERCOCET/ROXICET) 5-325 MG per tablet Take 1 tablet by mouth every 6 (six) hours as needed.      . pentoxifylline (TRENTAL) 400 MG CR tablet Take 400 mg by mouth 2 (two) times daily.      Marland Kitchen DISCONTD: amLODipine-benazepril (LOTREL) 10-20 MG per capsule Take 1 capsule by mouth daily.      Marland Kitchen DISCONTD: atorvastatin (LIPITOR) 40 MG tablet 40 mg. Take 1 tablet (40 mg total) by mouth daily.      Marland Kitchen DISCONTD: oxyCODONE-acetaminophen (PERCOCET/ROXICET) 5-325 MG per tablet 1 tablet. Take 1 tablet by mouth every 6 (six) hours as needed (severe pain).      . DISCONTD: pentoxifylline (TRENTAL) 400 MG CR tablet 1 tablet. 1 tab by mouth 2 times a day      . hydrochlorothiazide (MICROZIDE) 12.5 MG capsule Take 12.5 mg by mouth daily.      Marland Kitchen lisinopril (PRINIVIL,ZESTRIL) 10 MG tablet Take 10 mg by mouth daily.      . metFORMIN (GLUCOPHAGE) 1000 MG tablet Take 1,000 mg by mouth 2 (two) times daily with a meal.      .  methotrexate (RHEUMATREX) 2.5 MG tablet Take 3 tablets (7.5 mg total) by mouth once a week.  39 tablet  3    Review of Systems  Constitutional: Negative for fever, chills, activity change, appetite change, fatigue and unexpected weight change.  Eyes: Negative for visual disturbance.  Respiratory: Negative for cough and shortness of breath.   Cardiovascular: Positive for leg swelling. Negative for chest pain and palpitations.  Gastrointestinal: Negative for abdominal pain and abdominal distention.  Genitourinary: Negative for dysuria, urgency and difficulty urinating.  Musculoskeletal: Negative for arthralgias.  Skin: Positive for color change and wound. Negative for rash.  Hematological: Negative for adenopathy.  Psychiatric/Behavioral: Negative for disturbed wake/sleep cycle and dysphoric mood. The patient is  not nervous/anxious.        Objective:   Physical Exam  Constitutional: He is oriented to person, place, and time. He appears well-developed and well-nourished. No distress.  HENT:  Head: Normocephalic and atraumatic.  Right Ear: External ear normal.  Left Ear: External ear normal.  Nose: Nose normal.  Mouth/Throat: Oropharynx is clear and moist. No oropharyngeal exudate.  Eyes: Conjunctivae normal and EOM are normal. Pupils are equal, round, and reactive to light. Right eye exhibits no discharge. Left eye exhibits no discharge. No scleral icterus.  Neck: Normal range of motion. Neck supple. No tracheal deviation present. No thyromegaly present.  Cardiovascular: Normal rate, regular rhythm and normal heart sounds.  Exam reveals no gallop and no friction rub.   No murmur heard. Pulmonary/Chest: Effort normal and breath sounds normal. No respiratory distress. He has no wheezes. He has no rales. He exhibits no tenderness.  Musculoskeletal: Normal range of motion. He exhibits no edema.  Lymphadenopathy:    He has no cervical adenopathy.  Neurological: He is alert and oriented to person, place, and time. No cranial nerve deficit. Coordination normal.  Skin: Skin is warm and dry. Lesion and rash noted. He is not diaphoretic. There is erythema. No pallor.     Psychiatric: He has a normal mood and affect. His behavior is normal. Judgment and thought content normal.          Assessment & Plan:

## 2012-10-11 NOTE — Assessment & Plan Note (Signed)
Will recheck lipids in one month given change to atorvastatin. Goal LDL less than 70.

## 2012-10-11 NOTE — Assessment & Plan Note (Signed)
S/p recent hospitalization with evaluation by vascular and dermatology. Biopsy showed changes consistent with cholesterol embolization syndrome. Pt was started on Atorvastatin and scheduled for follow up with wound care. Will plan to recheck lipids and LFTs in 1 month. Follow up here in 6 weeks and prn.

## 2012-10-12 ENCOUNTER — Ambulatory Visit: Payer: Self-pay | Admitting: Cardiothoracic Surgery

## 2012-10-12 ENCOUNTER — Encounter: Payer: Self-pay | Admitting: Cardiothoracic Surgery

## 2012-10-12 ENCOUNTER — Encounter: Payer: Self-pay | Admitting: Nurse Practitioner

## 2012-10-29 ENCOUNTER — Encounter (HOSPITAL_BASED_OUTPATIENT_CLINIC_OR_DEPARTMENT_OTHER): Payer: Medicare Other

## 2012-10-31 ENCOUNTER — Inpatient Hospital Stay: Payer: Self-pay | Admitting: Surgery

## 2012-10-31 LAB — COMPREHENSIVE METABOLIC PANEL
Anion Gap: 10 (ref 7–16)
BUN: 29 mg/dL — ABNORMAL HIGH (ref 7–18)
Calcium, Total: 9.6 mg/dL (ref 8.5–10.1)
Creatinine: 0.94 mg/dL (ref 0.60–1.30)
EGFR (African American): >60
EGFR (Non-African Amer.): >60
Glucose: 157 mg/dL — ABNORMAL HIGH (ref 65–99)
Osmolality: 274 (ref 275–301)
Potassium: 4.4 mmol/L (ref 3.5–5.1)
SGOT(AST): 22 U/L (ref 15–37)
Sodium: 132 mmol/L — ABNORMAL LOW (ref 136–145)

## 2012-10-31 LAB — CBC WITH DIFFERENTIAL/PLATELET
Basophil #: 0.3 10*3/uL — ABNORMAL HIGH (ref 0.0–0.1)
Basophil %: 1.1 %
Eosinophil #: 0.5 10*3/uL (ref 0.0–0.7)
HCT: 36.5 % — ABNORMAL LOW (ref 40.0–52.0)
Lymphocyte %: 4.9 %
MCHC: 32.4 g/dL (ref 32.0–36.0)
Monocyte %: 4.4 %
Neutrophil %: 87.6 %

## 2012-11-02 LAB — VANCOMYCIN, TROUGH: Vancomycin, Trough: 26 ug/mL (ref 10–20)

## 2012-11-03 LAB — CBC WITH DIFFERENTIAL/PLATELET
Basophil %: 0.8 %
Eosinophil %: 5.1 %
HGB: 9.5 g/dL — ABNORMAL LOW (ref 13.0–18.0)
MCH: 27.8 pg (ref 26.0–34.0)
MCV: 86 fL (ref 80–100)
Monocyte #: 0.9 x10 3/mm (ref 0.2–1.0)
Monocyte %: 6.2 %
Neutrophil %: 76.4 %
Platelet: 554 10*3/uL — ABNORMAL HIGH (ref 150–440)
RBC: 3.4 10*6/uL — ABNORMAL LOW (ref 4.40–5.90)
WBC: 15.2 10*3/uL — ABNORMAL HIGH (ref 3.8–10.6)

## 2012-11-04 LAB — BASIC METABOLIC PANEL
Anion Gap: 7 (ref 7–16)
BUN: 11 mg/dL (ref 7–18)
Chloride: 100 mmol/L (ref 98–107)
Creatinine: 0.78 mg/dL (ref 0.60–1.30)
Potassium: 3.7 mmol/L (ref 3.5–5.1)

## 2012-11-05 LAB — CBC WITH DIFFERENTIAL/PLATELET
Basophil #: 0.2 10*3/uL — ABNORMAL HIGH (ref 0.0–0.1)
Eosinophil #: 0.9 10*3/uL — ABNORMAL HIGH (ref 0.0–0.7)
Eosinophil %: 6.2 %
Lymphocyte #: 1.6 10*3/uL (ref 1.0–3.6)
Lymphocyte %: 10.7 %
Monocyte #: 1 x10 3/mm (ref 0.2–1.0)
Neutrophil %: 75.2 %
Platelet: 590 10*3/uL — ABNORMAL HIGH (ref 150–440)
RDW: 18.2 % — ABNORMAL HIGH (ref 11.5–14.5)
WBC: 15.1 10*3/uL — ABNORMAL HIGH (ref 3.8–10.6)

## 2012-11-05 LAB — CREATININE, SERUM
EGFR (African American): >60
EGFR (Non-African Amer.): >60

## 2012-11-05 LAB — VANCOMYCIN, TROUGH: Vancomycin, Trough: 14 ug/mL (ref 10–20)

## 2012-11-06 ENCOUNTER — Other Ambulatory Visit: Payer: Medicare Other

## 2012-11-07 LAB — WOUND CULTURE

## 2012-11-13 ENCOUNTER — Ambulatory Visit (INDEPENDENT_AMBULATORY_CARE_PROVIDER_SITE_OTHER): Payer: Medicare Other | Admitting: Internal Medicine

## 2012-11-13 ENCOUNTER — Encounter: Payer: Self-pay | Admitting: Internal Medicine

## 2012-11-13 VITALS — BP 130/80 | HR 93 | Temp 97.9°F | Resp 16

## 2012-11-13 DIAGNOSIS — E119 Type 2 diabetes mellitus without complications: Secondary | ICD-10-CM

## 2012-11-13 DIAGNOSIS — I96 Gangrene, not elsewhere classified: Secondary | ICD-10-CM | POA: Insufficient documentation

## 2012-11-13 DIAGNOSIS — E1169 Type 2 diabetes mellitus with other specified complication: Secondary | ICD-10-CM

## 2012-11-13 DIAGNOSIS — Z1331 Encounter for screening for depression: Secondary | ICD-10-CM

## 2012-11-13 DIAGNOSIS — E669 Obesity, unspecified: Secondary | ICD-10-CM

## 2012-11-13 DIAGNOSIS — S31109A Unspecified open wound of abdominal wall, unspecified quadrant without penetration into peritoneal cavity, initial encounter: Secondary | ICD-10-CM

## 2012-11-13 MED ORDER — HYDROMORPHONE HCL 2 MG PO TABS: 2.0000 mg | ORAL_TABLET | ORAL | Status: AC | PRN

## 2012-11-13 NOTE — Assessment & Plan Note (Signed)
Patient has an open wound of the abdominal wall after having cholesterol embolization after angioplasty. Wound Vac is currently in place. Patient is followed by both general surgery for debridement and by wound care. He has severe pain with change of his wound VAC which is not controlled with use of Percocet. Will try adding Dilaudid. Encouraged him to be cautious with this medication because of potential of oversedation. Followup in 3 months or sooner as needed.

## 2012-11-13 NOTE — Assessment & Plan Note (Signed)
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 This occurred after angioplasty. He is currently on statin, aspirin, and Trental. He is followed by vascular surgery and general surgery. He reports that plan is continued observation for now. Will request records on recent evaluation and management.

## 2012-11-13 NOTE — Assessment & Plan Note (Signed)
Patient reports that recent hemoglobin A1c performed at the [facility] was well controlled. Will obtain records on this.

## 2012-11-13 NOTE — Progress Notes (Signed)
Subjective:    Patient ID: Shane [name], male    DOB: 12/06/1946, 66 y.o.   MRN: [id number]  HPI 66 year old male with history of multiple sclerosis, diabetes, and peripheral vascular disease status post recent angioplasty complicated by cholesterol embolization syndrome and multiple areas of necrosis and subsequent wound formation. He reports he was recently hospitalized for debridement of his abdominal wound. Wound VAC was placed and he is now followed by home health. He reports significant pain with changing of his dressings. He has been taking Percocet with no improvement in symptoms. He also reports gangrene in his left foot. 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 He reports that the current plan is to monitor for now rather than proceed with amputation. He denies pain at this location.  He reports that in regards to his diabetes he recently had labs checked including hemoglobin A1c which was well-controlled. This was performed at the [facility]. He reports full compliance with his medications.  Outpatient Encounter Prescriptions as of 11/13/2012  Medication Sig Dispense Refill  . amantadine (SYMMETREL) 100 MG capsule Take 100 mg by mouth 2 (two) times daily.      Marland Kitchen amLODipine-benazepril (LOTREL) 10-20 MG per capsule Take 1 capsule by mouth daily.  30 capsule  0  . aspirin 75 MG chewable tablet 1 tablet. 1 tab by mouth daily      . atorvastatin (LIPITOR) 40 MG tablet Take 40 mg by mouth daily.      . baclofen (LIORESAL) 10 MG tablet Take 20 mg by mouth 3 (three) times daily.      . Cholecalciferol (D 1000) 1000 UNITS capsule Take 1,000 Units by mouth 2 (two) times daily.      . clindamycin (CLEOCIN) 300 MG capsule       . clobetasol cream (TEMOVATE) 0.05 % Apply to affected area(s) 3 times per day      . cloNIDine (CATAPRES) 0.1 MG tablet Take 0.1 mg by mouth 2 (two) times daily.      . clopidogrel (PLAVIX) 75 MG  tablet Take 75 mg by mouth daily.      . diazepam (VALIUM) 5 MG tablet Take 5 mg by mouth 2 (two) times daily.      . fluticasone (FLONASE) 50 MCG/ACT nasal spray 2 sprays. 2 spray in each nostril daily as needed      . gabapentin (NEURONTIN) 300 MG capsule Take 300 mg by mouth 4 (four) times daily.      . hydrochlorothiazide (MICROZIDE) 12.5 MG capsule Take 12.5 mg by mouth daily.      Marland Kitchen HYDROcodone-acetaminophen (NORCO/VICODIN) 5-325 MG per tablet       . insulin NPH-insulin regular (NOVOLIN 70/30) (70-30) 100 UNIT/ML injection Inject into the skin as directed. Inject subcutaneously 18 units every morning & 20 units every night.      Marland Kitchen lisinopril (PRINIVIL,ZESTRIL) 10 MG tablet Take 10 mg by mouth daily.      . metFORMIN (GLUCOPHAGE) 1000 MG tablet Take 1,000 mg by mouth 2 (two) times daily with a meal.      . methotrexate (RHEUMATREX) 2.5 MG tablet Take 3 tablets (7.5 mg total) by mouth once a week.  39 tablet  3  . Multiple Vitamin (MULTIVITAMIN) tablet Take 1 tablet by mouth daily.      . naproxen (NAPROSYN) 250 MG tablet Take 250 mg by mouth 2 (two) times daily with a meal.      .  ofloxacin (FLOXIN) 0.3 % otic solution       . omeprazole (PRILOSEC) 20 MG capsule Take 20 mg by mouth daily.      Marland Kitchen oxyCODONE-acetaminophen (PERCOCET/ROXICET) 5-325 MG per tablet Take 1 tablet by mouth every 6 (six) hours as needed.      . pentoxifylline (TRENTAL) 400 MG CR tablet Take 400 mg by mouth 2 (two) times daily.      Marland Kitchen sulfamethoxazole-trimethoprim (BACTRIM DS) 800-160 MG per tablet       . HYDROmorphone (DILAUDID) 2 MG tablet Take 1 tablet (2 mg total) by mouth every 4 (four) hours as needed for pain (prior to change of wound dressing).  60 tablet  0   BP 130/80  Pulse 93  Temp 97.9 F (36.6 C) (Oral)  Resp 16  Review of Systems  Constitutional: Negative for fever, chills, activity change, appetite change, fatigue and unexpected weight change.  Eyes: Negative for visual disturbance.    Respiratory: Negative for cough and shortness of breath.   Cardiovascular: Negative for chest pain, palpitations and leg swelling.  Gastrointestinal: Positive for abdominal pain. Negative for abdominal distention.  Genitourinary: Negative for dysuria, urgency and difficulty urinating.  Musculoskeletal: Negative for arthralgias and gait problem.  Skin: Positive for color change and wound. Negative for rash.  Hematological: Negative for adenopathy.  Psychiatric/Behavioral: Negative for sleep disturbance and dysphoric mood. The patient is not nervous/anxious.        Objective:   Physical Exam  Constitutional: He is oriented to person, place, and time. He appears well-developed and well-nourished. No distress.  HENT:  Head: Normocephalic and atraumatic.  Right Ear: External ear normal.  Left Ear: External ear normal.  Nose: Nose normal.  Mouth/Throat: Oropharynx is clear and moist. No oropharyngeal exudate.  Eyes: Conjunctivae normal and EOM are normal. Pupils are equal, round, and reactive to light. Right eye exhibits no discharge. Left eye exhibits no discharge. No scleral icterus.  Neck: Normal range of motion. Neck supple. No tracheal deviation present. No thyromegaly present.  Cardiovascular: Normal rate, regular rhythm and normal heart sounds.  Exam reveals no gallop and no friction rub.   No murmur heard. Pulmonary/Chest: Effort normal and breath sounds normal. No respiratory distress. He has no wheezes. He has no rales. He exhibits no tenderness.  Abdominal:    Musculoskeletal: Normal range of motion. He exhibits no edema.  Lymphadenopathy:    He has no cervical adenopathy.  Neurological: He is alert and oriented to person, place, and time. No cranial nerve deficit. Coordination normal.  Skin: Skin is warm and dry. No rash noted. He is not diaphoretic. There is cyanosis (Distal left toes.). No erythema. No pallor.  Psychiatric: He has a normal mood and affect. His behavior is  normal. Judgment and thought content normal.          Assessment & Plan:

## 2012-11-20 ENCOUNTER — Inpatient Hospital Stay: Payer: Self-pay | Admitting: Surgery

## 2012-11-20 LAB — BASIC METABOLIC PANEL
Calcium, Total: 10 mg/dL (ref 8.5–10.1)
Chloride: 104 mmol/L (ref 98–107)
Co2: 24 mmol/L (ref 21–32)
Glucose: 155 mg/dL — ABNORMAL HIGH (ref 65–99)
Osmolality: 279 (ref 275–301)
Potassium: 4 mmol/L (ref 3.5–5.1)

## 2012-11-20 LAB — CBC WITH DIFFERENTIAL/PLATELET
Basophil #: 0.1 10*3/uL (ref 0.0–0.1)
Eosinophil %: 1.9 %
Lymphocyte %: 7.6 %
Monocyte %: 5.2 %
Neutrophil %: 84.7 %
Platelet: 516 10*3/uL — ABNORMAL HIGH (ref 150–440)
RBC: 4.51 10*6/uL (ref 4.40–5.90)
RDW: 19.9 % — ABNORMAL HIGH (ref 11.5–14.5)
WBC: 14.8 10*3/uL — ABNORMAL HIGH (ref 3.8–10.6)

## 2012-11-21 LAB — CBC WITH DIFFERENTIAL/PLATELET
Basophil #: 0.1 10*3/uL (ref 0.0–0.1)
Eosinophil %: 1.1 %
Lymphocyte #: 1.6 10*3/uL (ref 1.0–3.6)
MCH: 28.5 pg (ref 26.0–34.0)
MCHC: 33.3 g/dL (ref 32.0–36.0)
MCV: 85 fL (ref 80–100)
Monocyte #: 1.1 x10 3/mm — ABNORMAL HIGH (ref 0.2–1.0)
Monocyte %: 6.8 %
Neutrophil %: 81.9 %
Platelet: 465 10*3/uL — ABNORMAL HIGH (ref 150–440)

## 2012-11-21 LAB — COMPREHENSIVE METABOLIC PANEL
Anion Gap: 9 (ref 7–16)
BUN: 15 mg/dL (ref 7–18)
Calcium, Total: 8.9 mg/dL (ref 8.5–10.1)
Chloride: 103 mmol/L (ref 98–107)
EGFR (African American): >60
Glucose: 99 mg/dL (ref 65–99)
Potassium: 3.9 mmol/L (ref 3.5–5.1)
SGOT(AST): 22 U/L (ref 15–37)
SGPT (ALT): 30 U/L (ref 12–78)
Total Protein: 6.8 g/dL (ref 6.4–8.2)

## 2012-11-22 LAB — BASIC METABOLIC PANEL
Anion Gap: 8 (ref 7–16)
BUN: 13 mg/dL (ref 7–18)
Calcium, Total: 8.6 mg/dL (ref 8.5–10.1)
EGFR (African American): >60
Glucose: 77 mg/dL (ref 65–99)
Potassium: 3.9 mmol/L (ref 3.5–5.1)

## 2012-11-22 LAB — CBC WITH DIFFERENTIAL/PLATELET
Basophil #: 0.1 10*3/uL (ref 0.0–0.1)
Eosinophil #: 0.4 10*3/uL (ref 0.0–0.7)
HGB: 8.1 g/dL — ABNORMAL LOW (ref 13.0–18.0)
Lymphocyte #: 1.8 10*3/uL (ref 1.0–3.6)
MCH: 25.8 pg — ABNORMAL LOW (ref 26.0–34.0)
Monocyte #: 1.1 x10 3/mm — ABNORMAL HIGH (ref 0.2–1.0)
Neutrophil #: 10.7 10*3/uL — ABNORMAL HIGH (ref 1.4–6.5)
Platelet: 401 10*3/uL (ref 150–440)
RBC: 3.15 10*6/uL — ABNORMAL LOW (ref 4.40–5.90)
WBC: 14.1 10*3/uL — ABNORMAL HIGH (ref 3.8–10.6)

## 2012-11-23 ENCOUNTER — Institutional Professional Consult (permissible substitution)
Admission: RE | Admit: 2012-11-23 | Discharge: 2013-01-12 | Disposition: E | Payer: Self-pay | Source: Ambulatory Visit | Attending: Internal Medicine | Admitting: Internal Medicine

## 2012-11-23 LAB — CBC WITH DIFFERENTIAL/PLATELET
Basophil %: 0.5 %
Eosinophil #: 0.4 10*3/uL (ref 0.0–0.7)
Eosinophil %: 3.2 %
HGB: 8.7 g/dL — ABNORMAL LOW (ref 13.0–18.0)
Lymphocyte %: 12.5 %
MCHC: 32.6 g/dL (ref 32.0–36.0)
Monocyte %: 7.2 %
Neutrophil %: 76.6 %
RBC: 3.13 10*6/uL — ABNORMAL LOW (ref 4.40–5.90)
WBC: 13.9 10*3/uL — ABNORMAL HIGH (ref 3.8–10.6)

## 2012-11-24 LAB — COMPREHENSIVE METABOLIC PANEL
ALT: 83 U/L — ABNORMAL HIGH (ref 0–53)
AST: 52 U/L — ABNORMAL HIGH (ref 0–37)
Albumin: 2.4 g/dL — ABNORMAL LOW (ref 3.5–5.2)
Alkaline Phosphatase: 157 U/L — ABNORMAL HIGH (ref 39–117)
Chloride: 95 mEq/L — ABNORMAL LOW (ref 96–112)
Potassium: 4.5 mEq/L (ref 3.5–5.1)
Total Bilirubin: 0.2 mg/dL — ABNORMAL LOW (ref 0.3–1.2)

## 2012-11-24 LAB — CBC WITH DIFFERENTIAL/PLATELET
Basophils Absolute: 0.1 10*3/uL (ref 0.0–0.1)
Basophils Relative: 0 % (ref 0–1)
Hemoglobin: 9 g/dL — ABNORMAL LOW (ref 13.0–17.0)
MCHC: 32.1 g/dL (ref 30.0–36.0)
Monocytes Relative: 7 % (ref 3–12)
Neutro Abs: 10.5 10*3/uL — ABNORMAL HIGH (ref 1.7–7.7)
Neutrophils Relative %: 76 % (ref 43–77)

## 2012-11-24 LAB — TSH: TSH: 2.966 u[IU]/mL (ref 0.350–4.500)

## 2012-11-24 LAB — C-REACTIVE PROTEIN: CRP: 19.1 mg/dL — ABNORMAL HIGH (ref <0.60)

## 2012-11-24 LAB — VITAMIN B12: Vitamin B-12: 840 pg/mL (ref 211–911)

## 2012-11-24 LAB — T4, FREE: Free T4: 1.37 ng/dL (ref 0.80–1.80)

## 2012-11-27 LAB — BASIC METABOLIC PANEL
CO2: 21 mEq/L (ref 19–32)
Calcium: 9.3 mg/dL (ref 8.4–10.5)
Creatinine, Ser: 0.78 mg/dL (ref 0.50–1.35)
Glucose, Bld: 90 mg/dL (ref 70–99)
Sodium: 130 mEq/L — ABNORMAL LOW (ref 135–145)

## 2012-11-27 LAB — CBC
Hemoglobin: 8.7 g/dL — ABNORMAL LOW (ref 13.0–17.0)
MCH: 26.7 pg (ref 26.0–34.0)
MCV: 82.8 fL (ref 78.0–100.0)
RBC: 3.26 MIL/uL — ABNORMAL LOW (ref 4.22–5.81)

## 2012-11-28 ENCOUNTER — Other Ambulatory Visit (HOSPITAL_COMMUNITY): Payer: Self-pay

## 2012-11-28 LAB — CBC WITH DIFFERENTIAL/PLATELET
Hemoglobin: 8.9 g/dL — ABNORMAL LOW (ref 13.0–17.0)
Lymphocytes Relative: 8 % — ABNORMAL LOW (ref 12–46)
Lymphs Abs: 1.5 10*3/uL (ref 0.7–4.0)
MCH: 27.2 pg (ref 26.0–34.0)
Monocytes Relative: 6 % (ref 3–12)
Neutro Abs: 17 10*3/uL — ABNORMAL HIGH (ref 1.7–7.7)
Neutrophils Relative %: 85 % — ABNORMAL HIGH (ref 43–77)
RBC: 3.27 MIL/uL — ABNORMAL LOW (ref 4.22–5.81)
WBC: 20 10*3/uL — ABNORMAL HIGH (ref 4.0–10.5)

## 2012-11-28 LAB — BASIC METABOLIC PANEL
BUN: 15 mg/dL (ref 6–23)
Chloride: 94 mEq/L — ABNORMAL LOW (ref 96–112)
Glucose, Bld: 97 mg/dL (ref 70–99)
Potassium: 4.3 mEq/L (ref 3.5–5.1)

## 2012-11-28 LAB — URINALYSIS, ROUTINE W REFLEX MICROSCOPIC
Bilirubin Urine: NEGATIVE
Glucose, UA: NEGATIVE mg/dL
Ketones, ur: NEGATIVE mg/dL
Leukocytes, UA: NEGATIVE
Specific Gravity, Urine: 1.02 (ref 1.005–1.030)
pH: 6.5 (ref 5.0–8.0)

## 2012-11-28 LAB — PROCALCITONIN: Procalcitonin: <0.1 ng/mL

## 2012-11-29 LAB — CBC WITH DIFFERENTIAL/PLATELET
Basophils Absolute: 0 10*3/uL (ref 0.0–0.1)
Basophils Relative: 0 % (ref 0–1)
Eosinophils Absolute: 0.2 10*3/uL (ref 0.0–0.7)
Eosinophils Relative: 1 % (ref 0–5)
HCT: 27.5 % — ABNORMAL LOW (ref 39.0–52.0)
Hemoglobin: 8.9 g/dL — ABNORMAL LOW (ref 13.0–17.0)
MCH: 26.6 pg (ref 26.0–34.0)
MCHC: 32.4 g/dL (ref 30.0–36.0)
Monocytes Absolute: 1.2 10*3/uL — ABNORMAL HIGH (ref 0.1–1.0)
Monocytes Relative: 6 % (ref 3–12)
Neutro Abs: 15.6 10*3/uL — ABNORMAL HIGH (ref 1.7–7.7)
RDW: 18.2 % — ABNORMAL HIGH (ref 11.5–15.5)

## 2012-11-29 LAB — COMPREHENSIVE METABOLIC PANEL
AST: 18 U/L (ref 0–37)
Albumin: 2.4 g/dL — ABNORMAL LOW (ref 3.5–5.2)
BUN: 15 mg/dL (ref 6–23)
Calcium: 8.8 mg/dL (ref 8.4–10.5)
Chloride: 98 mEq/L (ref 96–112)
Creatinine, Ser: 0.58 mg/dL (ref 0.50–1.35)
Total Protein: 6.4 g/dL (ref 6.0–8.3)

## 2012-11-29 LAB — URINE CULTURE: Colony Count: NO GROWTH

## 2012-11-30 LAB — CBC WITH DIFFERENTIAL/PLATELET
Eosinophils Absolute: 0.4 10*3/uL (ref 0.0–0.7)
Eosinophils Relative: 2 % (ref 0–5)
HCT: 27.6 % — ABNORMAL LOW (ref 39.0–52.0)
Hemoglobin: 8.9 g/dL — ABNORMAL LOW (ref 13.0–17.0)
Lymphocytes Relative: 9 % — ABNORMAL LOW (ref 12–46)
Lymphs Abs: 1.6 10*3/uL (ref 0.7–4.0)
MCH: 27.1 pg (ref 26.0–34.0)
MCV: 84.1 fL (ref 78.0–100.0)
Monocytes Relative: 7 % (ref 3–12)
Platelets: 562 10*3/uL — ABNORMAL HIGH (ref 150–400)
RBC: 3.28 MIL/uL — ABNORMAL LOW (ref 4.22–5.81)
WBC: 18.5 10*3/uL — ABNORMAL HIGH (ref 4.0–10.5)

## 2012-11-30 LAB — VANCOMYCIN, TROUGH: Vancomycin Tr: 16.2 ug/mL (ref 10.0–20.0)

## 2012-12-01 LAB — BASIC METABOLIC PANEL
BUN: 15 mg/dL (ref 6–23)
Chloride: 98 mEq/L (ref 96–112)
Creatinine, Ser: 0.45 mg/dL — ABNORMAL LOW (ref 0.50–1.35)
Glucose, Bld: 165 mg/dL — ABNORMAL HIGH (ref 70–99)
Potassium: 3.7 mEq/L (ref 3.5–5.1)

## 2012-12-01 LAB — CBC WITH DIFFERENTIAL/PLATELET
Basophils Relative: 1 % (ref 0–1)
Eosinophils Relative: 3 % (ref 0–5)
HCT: 26.9 % — ABNORMAL LOW (ref 39.0–52.0)
Hemoglobin: 8.6 g/dL — ABNORMAL LOW (ref 13.0–17.0)
Lymphocytes Relative: 9 % — ABNORMAL LOW (ref 12–46)
MCH: 26.8 pg (ref 26.0–34.0)
MCHC: 32 g/dL (ref 30.0–36.0)
Neutro Abs: 16 10*3/uL — ABNORMAL HIGH (ref 1.7–7.7)
Neutrophils Relative %: 81 % — ABNORMAL HIGH (ref 43–77)
RBC: 3.21 MIL/uL — ABNORMAL LOW (ref 4.22–5.81)

## 2012-12-01 LAB — CLOSTRIDIUM DIFFICILE BY PCR: Toxigenic C. Difficile by PCR: POSITIVE — AB

## 2012-12-03 LAB — BASIC METABOLIC PANEL
BUN: 14 mg/dL (ref 6–23)
Calcium: 9 mg/dL (ref 8.4–10.5)
Creatinine, Ser: 0.53 mg/dL (ref 0.50–1.35)
GFR calc non Af Amer: >90 mL/min (ref >90)
Glucose, Bld: 116 mg/dL — ABNORMAL HIGH (ref 70–99)

## 2012-12-03 LAB — CBC
HCT: 26.6 % — ABNORMAL LOW (ref 39.0–52.0)
Hemoglobin: 8.1 g/dL — ABNORMAL LOW (ref 13.0–17.0)
MCH: 25.8 pg — ABNORMAL LOW (ref 26.0–34.0)
MCHC: 30.5 g/dL (ref 30.0–36.0)
MCV: 84.7 fL (ref 78.0–100.0)
RDW: 19.1 % — ABNORMAL HIGH (ref 11.5–15.5)

## 2012-12-04 LAB — CBC
Hemoglobin: 8.7 g/dL — ABNORMAL LOW (ref 13.0–17.0)
MCH: 26.4 pg (ref 26.0–34.0)
MCHC: 31.6 g/dL (ref 30.0–36.0)
Platelets: 722 10*3/uL — ABNORMAL HIGH (ref 150–400)
RDW: 19.2 % — ABNORMAL HIGH (ref 11.5–15.5)

## 2012-12-04 LAB — IRON AND TIBC: Saturation Ratios: 17 % — ABNORMAL LOW (ref 20–55)

## 2012-12-04 LAB — CULTURE, BLOOD (ROUTINE X 2): Culture: NO GROWTH

## 2012-12-04 LAB — FERRITIN: Ferritin: 99 ng/mL (ref 22–322)

## 2012-12-05 LAB — CBC WITH DIFFERENTIAL/PLATELET
Basophils Relative: 0 % (ref 0–1)
Eosinophils Absolute: 0.3 10*3/uL (ref 0.0–0.7)
Eosinophils Relative: 2 % (ref 0–5)
HCT: 27.4 % — ABNORMAL LOW (ref 39.0–52.0)
Hemoglobin: 8.6 g/dL — ABNORMAL LOW (ref 13.0–17.0)
Lymphs Abs: 1.9 10*3/uL (ref 0.7–4.0)
MCH: 26.3 pg (ref 26.0–34.0)
MCHC: 31.4 g/dL (ref 30.0–36.0)
MCV: 83.8 fL (ref 78.0–100.0)
Monocytes Absolute: 1.1 10*3/uL — ABNORMAL HIGH (ref 0.1–1.0)
Monocytes Relative: 6 % (ref 3–12)
Neutrophils Relative %: 82 % — ABNORMAL HIGH (ref 43–77)
RBC: 3.27 MIL/uL — ABNORMAL LOW (ref 4.22–5.81)

## 2012-12-05 LAB — BASIC METABOLIC PANEL
BUN: 13 mg/dL (ref 6–23)
Calcium: 8.7 mg/dL (ref 8.4–10.5)
Creatinine, Ser: 0.46 mg/dL — ABNORMAL LOW (ref 0.50–1.35)
GFR calc Af Amer: >90 mL/min (ref >90)
GFR calc non Af Amer: >90 mL/min (ref >90)
Glucose, Bld: 95 mg/dL (ref 70–99)

## 2012-12-06 LAB — FOLATE RBC: RBC Folate: 1878 ng/mL — ABNORMAL HIGH (ref >=366)

## 2012-12-07 LAB — CBC
HCT: 29.7 % — ABNORMAL LOW (ref 39.0–52.0)
MCH: 26.6 pg (ref 26.0–34.0)
MCV: 83.2 fL (ref 78.0–100.0)
Platelets: 801 10*3/uL — ABNORMAL HIGH (ref 150–400)
RDW: 20.1 % — ABNORMAL HIGH (ref 11.5–15.5)
WBC: 19.2 10*3/uL — ABNORMAL HIGH (ref 4.0–10.5)

## 2012-12-07 LAB — BASIC METABOLIC PANEL
BUN: 14 mg/dL (ref 6–23)
Calcium: 9 mg/dL (ref 8.4–10.5)
Chloride: 94 mEq/L — ABNORMAL LOW (ref 96–112)
Creatinine, Ser: 0.52 mg/dL (ref 0.50–1.35)
GFR calc Af Amer: >90 mL/min (ref >90)

## 2012-12-08 LAB — CBC WITH DIFFERENTIAL/PLATELET
Basophils Absolute: 0 10*3/uL (ref 0.0–0.1)
Basophils Relative: 0 % (ref 0–1)
Eosinophils Relative: 1 % (ref 0–5)
HCT: 28.8 % — ABNORMAL LOW (ref 39.0–52.0)
MCHC: 31.6 g/dL (ref 30.0–36.0)
MCV: 83.2 fL (ref 78.0–100.0)
Monocytes Absolute: 1.1 10*3/uL — ABNORMAL HIGH (ref 0.1–1.0)
Platelets: 742 10*3/uL — ABNORMAL HIGH (ref 150–400)
RDW: 20.1 % — ABNORMAL HIGH (ref 11.5–15.5)
WBC: 16.6 10*3/uL — ABNORMAL HIGH (ref 4.0–10.5)

## 2012-12-08 LAB — BASIC METABOLIC PANEL
Calcium: 8.9 mg/dL (ref 8.4–10.5)
Creatinine, Ser: 0.5 mg/dL (ref 0.50–1.35)
GFR calc Af Amer: >90 mL/min (ref >90)
GFR calc non Af Amer: >90 mL/min (ref >90)
Sodium: 127 mEq/L — ABNORMAL LOW (ref 135–145)

## 2012-12-09 LAB — CBC WITH DIFFERENTIAL/PLATELET
Basophils Absolute: 0 10*3/uL (ref 0.0–0.1)
Eosinophils Relative: 0 % (ref 0–5)
Lymphocytes Relative: 7 % — ABNORMAL LOW (ref 12–46)
MCV: 83.7 fL (ref 78.0–100.0)
Neutro Abs: 12 10*3/uL — ABNORMAL HIGH (ref 1.7–7.7)
Neutrophils Relative %: 84 % — ABNORMAL HIGH (ref 43–77)
Platelets: 751 10*3/uL — ABNORMAL HIGH (ref 150–400)
RDW: 20.6 % — ABNORMAL HIGH (ref 11.5–15.5)
WBC: 14.3 10*3/uL — ABNORMAL HIGH (ref 4.0–10.5)

## 2012-12-09 LAB — SODIUM
Sodium: 126 mEq/L — ABNORMAL LOW (ref 135–145)
Sodium: 128 mEq/L — ABNORMAL LOW (ref 135–145)

## 2012-12-09 LAB — BASIC METABOLIC PANEL
Calcium: 9.1 mg/dL (ref 8.4–10.5)
GFR calc Af Amer: >90 mL/min (ref >90)
GFR calc non Af Amer: >90 mL/min (ref >90)
Potassium: 4.4 mEq/L (ref 3.5–5.1)
Sodium: 129 mEq/L — ABNORMAL LOW (ref 135–145)

## 2012-12-09 LAB — MAGNESIUM: Magnesium: 2.3 mg/dL (ref 1.5–2.5)

## 2012-12-10 LAB — SODIUM
Sodium: 127 mEq/L — ABNORMAL LOW (ref 135–145)
Sodium: 127 mEq/L — ABNORMAL LOW (ref 135–145)
Sodium: 125 mEq/L — ABNORMAL LOW (ref 135–145)

## 2012-12-10 LAB — BASIC METABOLIC PANEL
BUN: 16 mg/dL (ref 6–23)
Chloride: 94 mEq/L — ABNORMAL LOW (ref 96–112)
Creatinine, Ser: 0.46 mg/dL — ABNORMAL LOW (ref 0.50–1.35)
GFR calc Af Amer: >90 mL/min (ref >90)

## 2012-12-11 LAB — URINE MICROSCOPIC-ADD ON

## 2012-12-11 LAB — BASIC METABOLIC PANEL
Calcium: 8.9 mg/dL (ref 8.4–10.5)
GFR calc Af Amer: >90 mL/min (ref >90)
GFR calc non Af Amer: >90 mL/min (ref >90)
Glucose, Bld: 142 mg/dL — ABNORMAL HIGH (ref 70–99)
Potassium: 3.8 mEq/L (ref 3.5–5.1)
Sodium: 127 mEq/L — ABNORMAL LOW (ref 135–145)

## 2012-12-11 LAB — CBC WITH DIFFERENTIAL/PLATELET
Basophils Absolute: 0 10*3/uL (ref 0.0–0.1)
Eosinophils Absolute: 0 10*3/uL (ref 0.0–0.7)
Lymphs Abs: 1 10*3/uL (ref 0.7–4.0)
MCH: 27 pg (ref 26.0–34.0)
Neutrophils Relative %: 88 % — ABNORMAL HIGH (ref 43–77)
Platelets: 689 10*3/uL — ABNORMAL HIGH (ref 150–400)
RBC: 3.45 MIL/uL — ABNORMAL LOW (ref 4.22–5.81)
RDW: 20.9 % — ABNORMAL HIGH (ref 11.5–15.5)
WBC: 17.5 10*3/uL — ABNORMAL HIGH (ref 4.0–10.5)

## 2012-12-11 LAB — URINALYSIS, ROUTINE W REFLEX MICROSCOPIC
Nitrite: NEGATIVE
Protein, ur: 30 mg/dL — AB
Specific Gravity, Urine: 1.022 (ref 1.005–1.030)
Urobilinogen, UA: 0.2 mg/dL (ref 0.0–1.0)
pH: 5 (ref 5.0–8.0)

## 2012-12-11 LAB — SODIUM
Sodium: 127 mEq/L — ABNORMAL LOW (ref 135–145)
Sodium: 127 mEq/L — ABNORMAL LOW (ref 135–145)

## 2012-12-11 LAB — PROCALCITONIN: Procalcitonin: <0.1 ng/mL

## 2012-12-12 LAB — COMPREHENSIVE METABOLIC PANEL
ALT: 14 U/L (ref 0–53)
Calcium: 8.9 mg/dL (ref 8.4–10.5)
GFR calc Af Amer: >90 mL/min (ref >90)
Glucose, Bld: 93 mg/dL (ref 70–99)
Sodium: 132 mEq/L — ABNORMAL LOW (ref 135–145)
Total Protein: 6.7 g/dL (ref 6.0–8.3)

## 2012-12-12 LAB — SODIUM
Sodium: 133 mEq/L — ABNORMAL LOW (ref 135–145)
Sodium: 131 mEq/L — ABNORMAL LOW (ref 135–145)
Sodium: 132 mEq/L — ABNORMAL LOW (ref 135–145)

## 2012-12-12 LAB — URINE CULTURE

## 2012-12-12 DEATH — deceased

## 2012-12-13 ENCOUNTER — Other Ambulatory Visit (HOSPITAL_COMMUNITY): Payer: Self-pay

## 2012-12-13 LAB — BASIC METABOLIC PANEL
GFR calc Af Amer: >90 mL/min (ref >90)
GFR calc non Af Amer: >90 mL/min (ref >90)
Potassium: 4.1 mEq/L (ref 3.5–5.1)
Sodium: 135 mEq/L (ref 135–145)

## 2012-12-13 LAB — CBC WITH DIFFERENTIAL/PLATELET
Basophils Absolute: 0 10*3/uL (ref 0.0–0.1)
Basophils Relative: 0 % (ref 0–1)
Eosinophils Absolute: 0 10*3/uL (ref 0.0–0.7)
Hemoglobin: 8.7 g/dL — ABNORMAL LOW (ref 13.0–17.0)
Lymphocytes Relative: 5 % — ABNORMAL LOW (ref 12–46)
MCHC: 31.8 g/dL (ref 30.0–36.0)
Neutrophils Relative %: 91 % — ABNORMAL HIGH (ref 43–77)
Platelets: 625 10*3/uL — ABNORMAL HIGH (ref 150–400)
RDW: 20.7 % — ABNORMAL HIGH (ref 11.5–15.5)

## 2012-12-13 LAB — PROCALCITONIN: Procalcitonin: 0.16 ng/mL

## 2012-12-13 LAB — SODIUM: Sodium: 137 mEq/L (ref 135–145)

## 2012-12-14 ENCOUNTER — Other Ambulatory Visit (HOSPITAL_COMMUNITY): Payer: Self-pay

## 2012-12-14 LAB — CBC WITH DIFFERENTIAL/PLATELET
Basophils Relative: 0 % (ref 0–1)
Hemoglobin: 8.6 g/dL — ABNORMAL LOW (ref 13.0–17.0)
Lymphs Abs: 0.9 10*3/uL (ref 0.7–4.0)
Monocytes Relative: 4 % (ref 3–12)
Neutro Abs: 17.3 10*3/uL — ABNORMAL HIGH (ref 1.7–7.7)
Neutrophils Relative %: 91 % — ABNORMAL HIGH (ref 43–77)
Platelets: 605 10*3/uL — ABNORMAL HIGH (ref 150–400)
RBC: 3.3 MIL/uL — ABNORMAL LOW (ref 4.22–5.81)

## 2012-12-14 LAB — BLOOD GAS, ARTERIAL
Patient temperature: 98.6
TCO2: 20.1 mmol/L (ref 0–100)
pCO2 arterial: 28.4 mmHg — ABNORMAL LOW (ref 35.0–45.0)
pH, Arterial: 7.446 (ref 7.350–7.450)

## 2012-12-14 LAB — BASIC METABOLIC PANEL
BUN: 35 mg/dL — ABNORMAL HIGH (ref 6–23)
Chloride: 109 mEq/L (ref 96–112)
GFR calc Af Amer: >90 mL/min (ref >90)
Potassium: 4.9 mEq/L (ref 3.5–5.1)
Sodium: 141 mEq/L (ref 135–145)

## 2012-12-15 LAB — VANCOMYCIN, TROUGH: Vancomycin Tr: 11.8 ug/mL (ref 10.0–20.0)

## 2012-12-19 LAB — CULTURE, BLOOD (ROUTINE X 2): Culture: NO GROWTH

## 2013-01-12 DEATH — deceased

## 2013-11-14 IMAGING — CR DG CHEST 2V
1 series · 3 of 3 positions shown · non-contrast
Comparison: none

REASON FOR EXAM: pleural effusion
COMMENTS:

PROCEDURE:     DXR - DXR CHEST PA (OR AP) AND LATERAL  - October 12, 2012 [DATE]
RESULT:     Comparison: None.

[Series 2: x chest ap · 0.14mm/px · 3 of 3 slices shown]
[im 1/3]
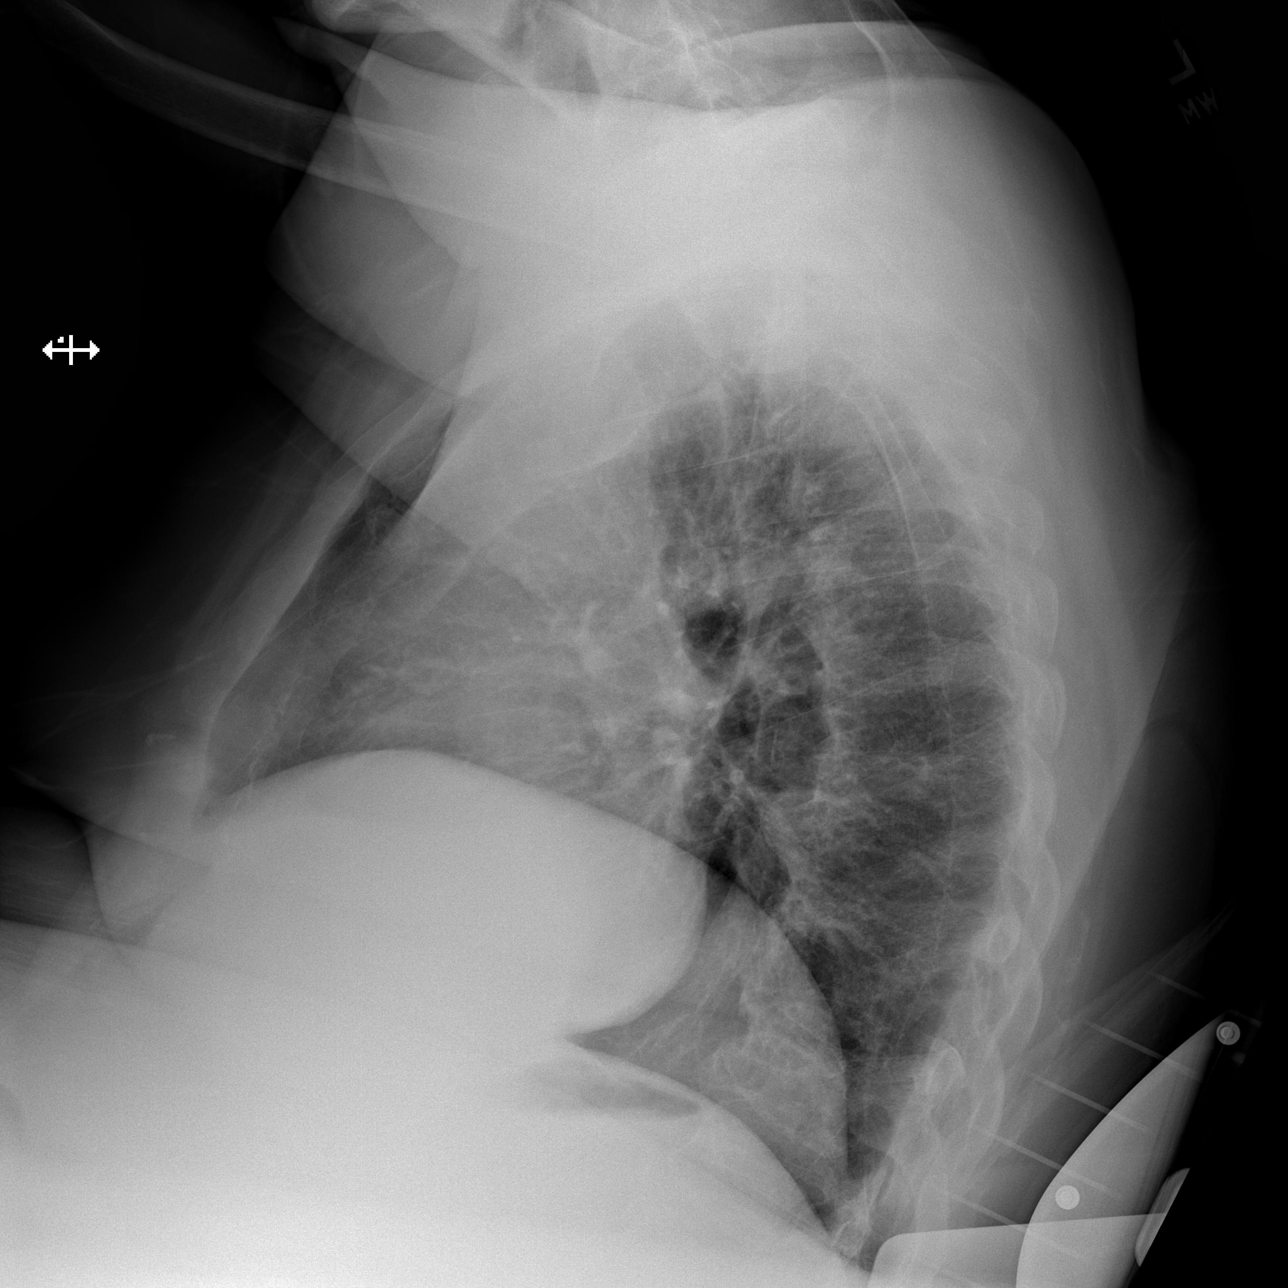
[im 2/3]
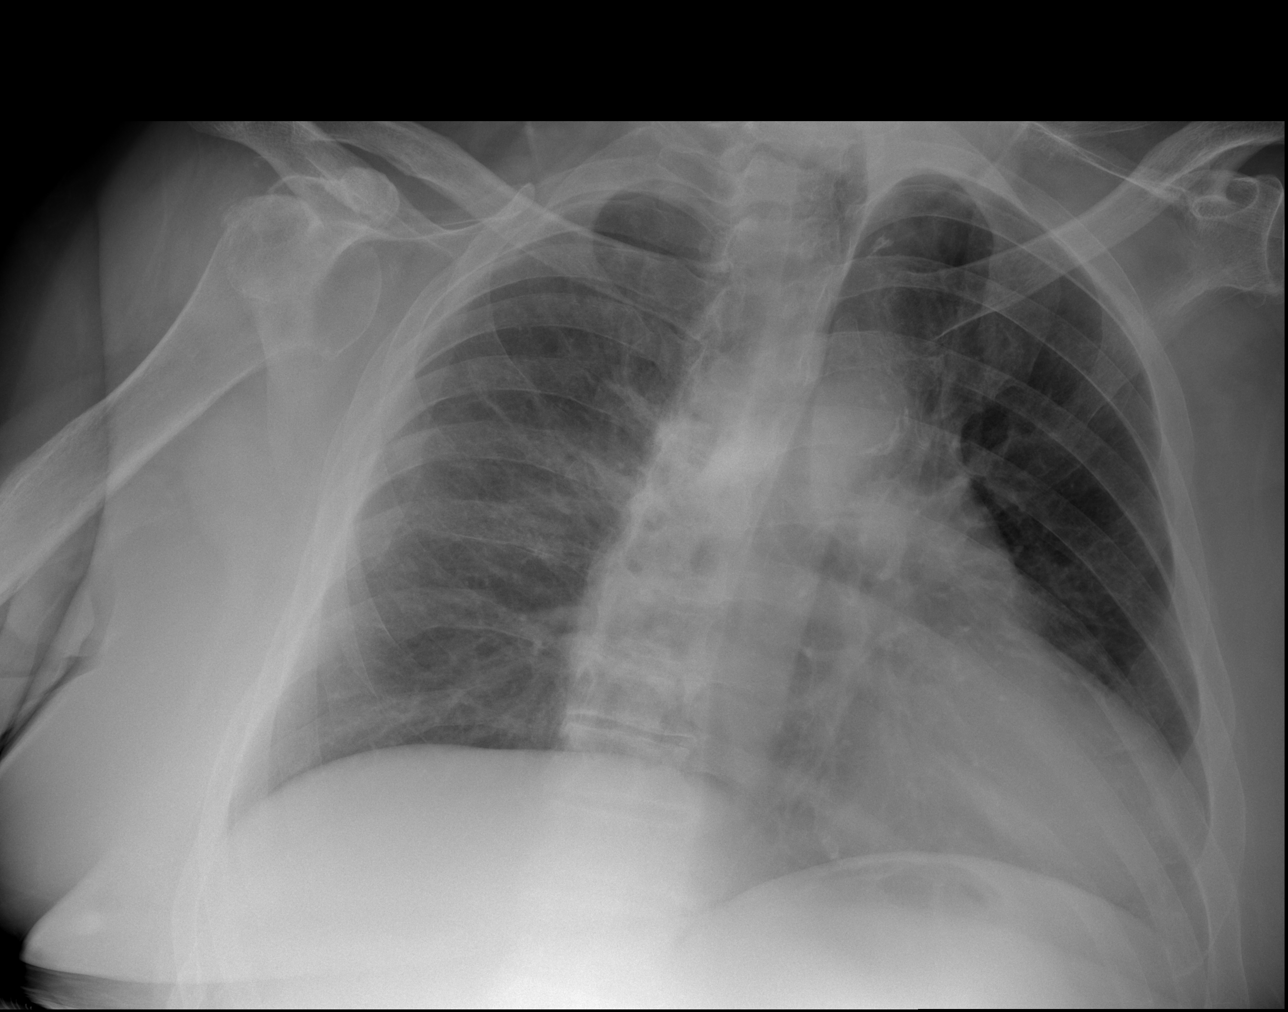
[im 3/3]
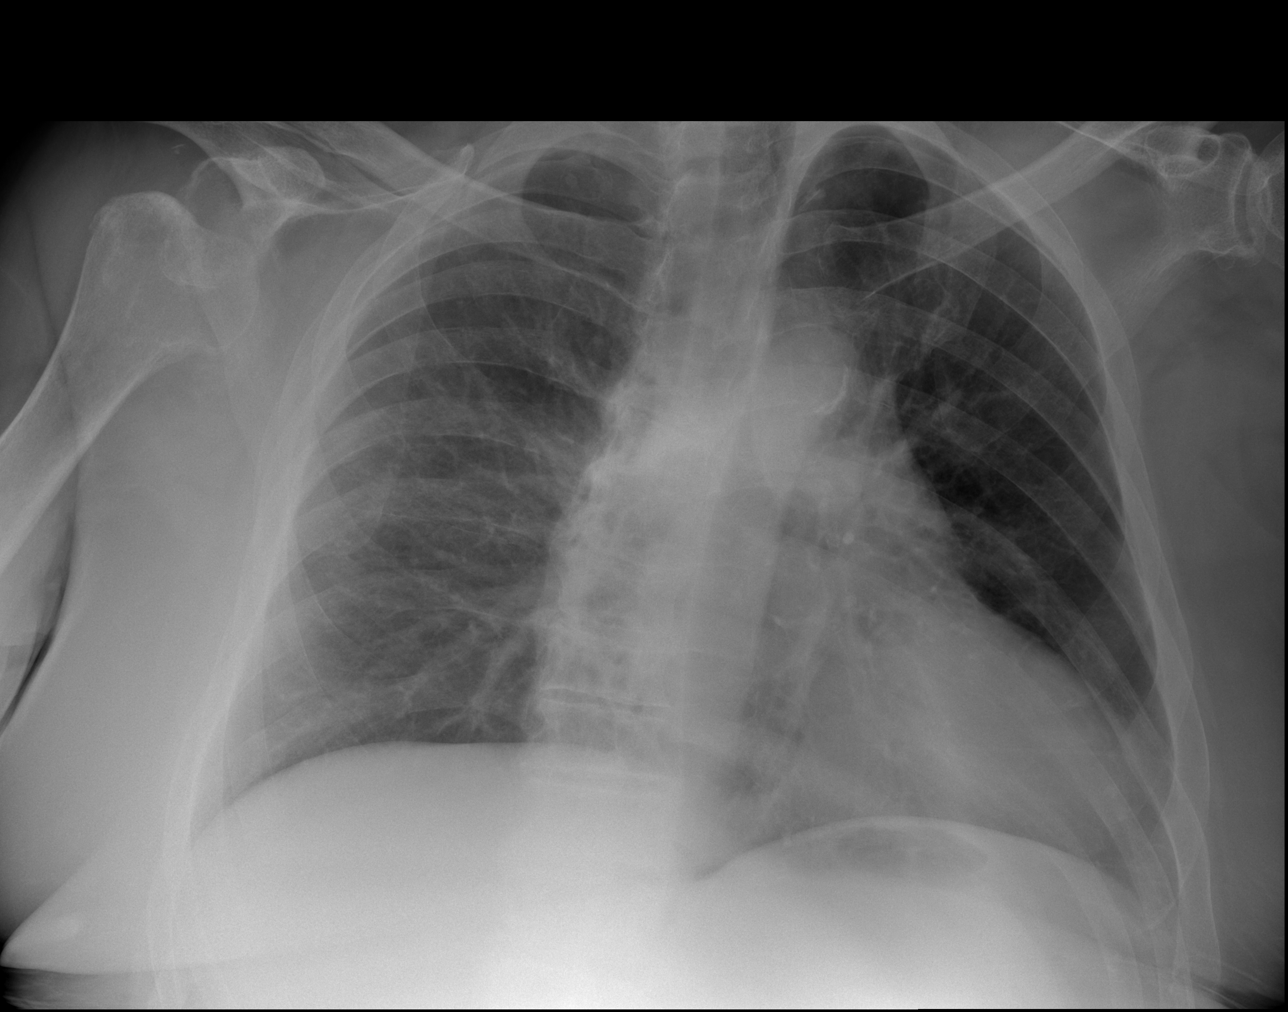

[3 of 3 positions shown; findings below may reference images not displayed]

FINDINGS: Evaluation is limited by patient rotation. The examination was performed in
a wheelchair. Heart size upper limits normal to mildly enlarged. No definite
focal pulmonary opacities. No definite pleural effusion seen. No
pneumothorax seen.

There there appears to be anterior dislocation of the right glenohumeral
joint. There is chronic appearing deformity of the right humeral head.
IMPRESSION: 1. Limited examination. No definite pleural effusion or acute
cardiopulmonary disease seen.
2. The right humerus appears anteriorly dislocated or markedly subluxed with
chronic appearing deformity of the right humeral head.

This was called to Elisabetta Done at 3335 hours 10/12/2012.

[REDACTED]

## 2013-12-03 IMAGING — CR DG CHEST 2V
1 series · 4 of 4 positions shown · non-contrast
Comparison: none

REASON FOR EXAM: assess for pleural effusion
COMMENTS:

[Series 1: ap · 0.17mm/px · 4 of 4 slices shown]
[im 1/4]
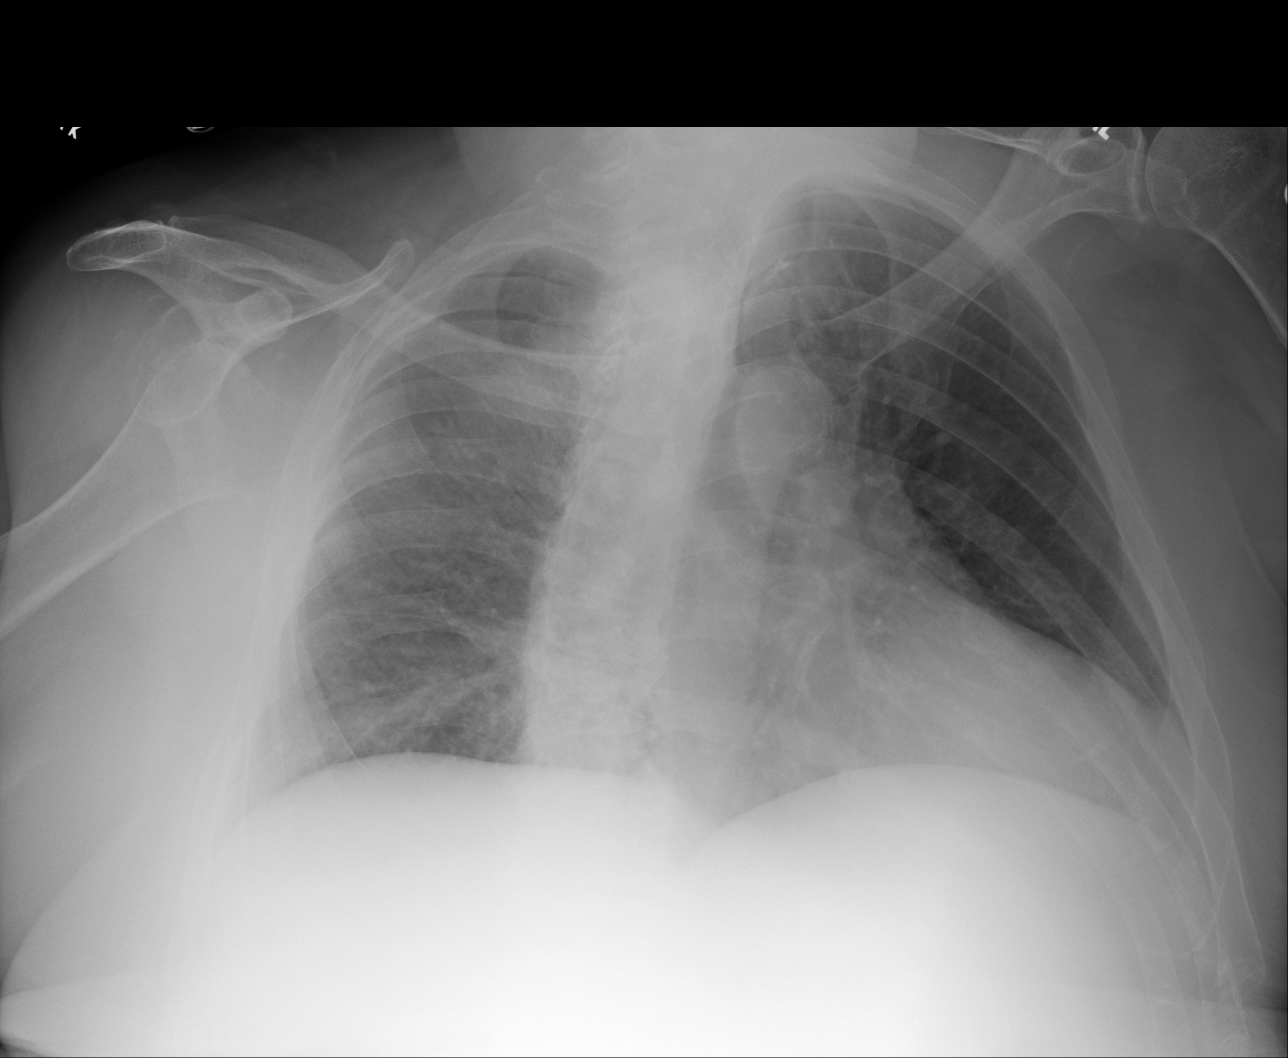
[im 2/4]
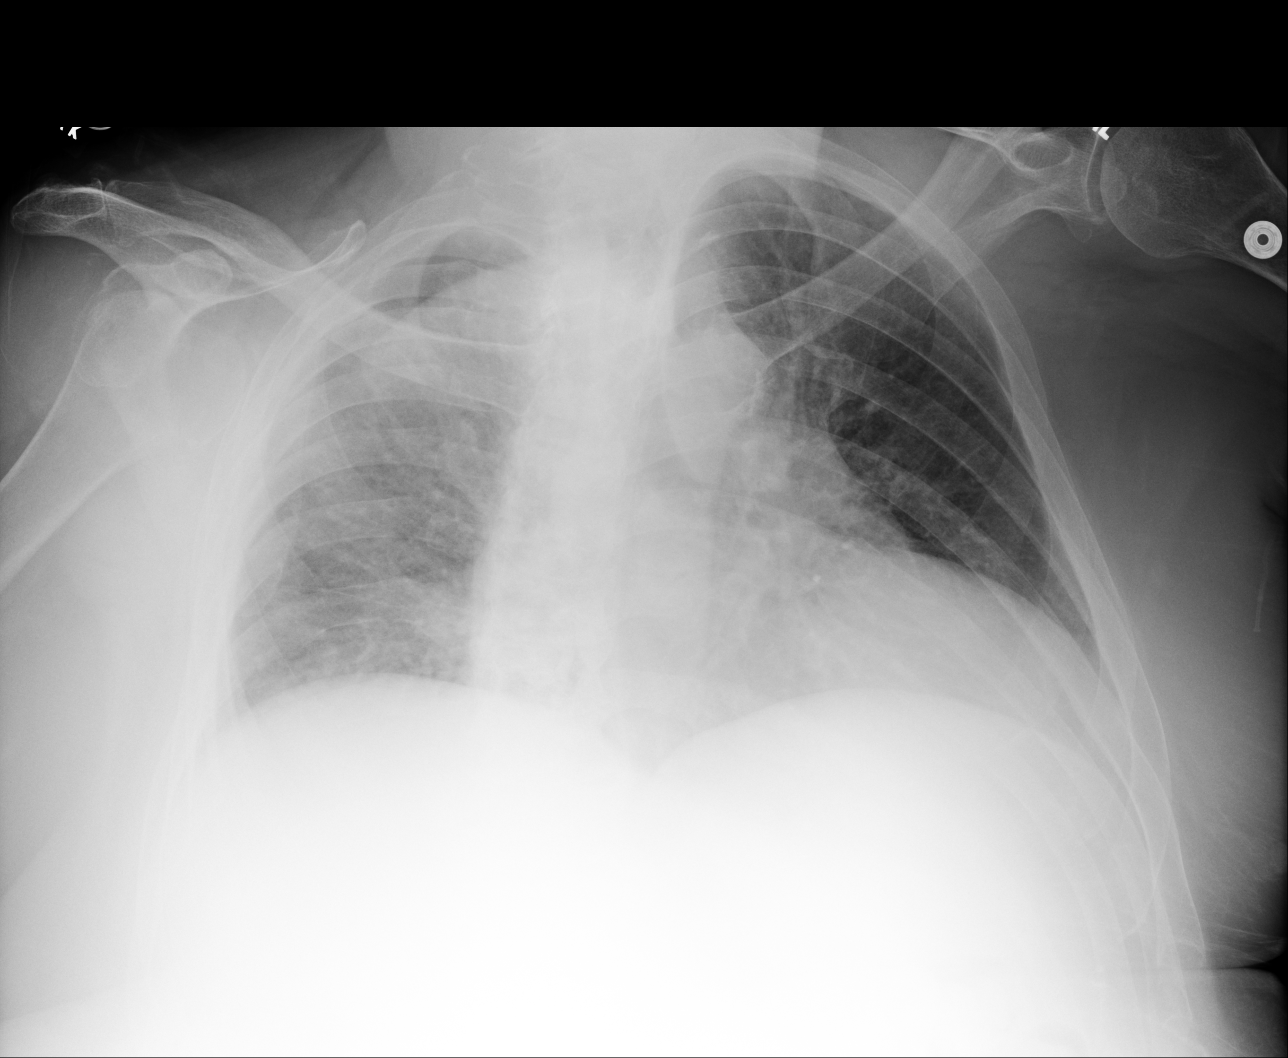
[im 3/4]
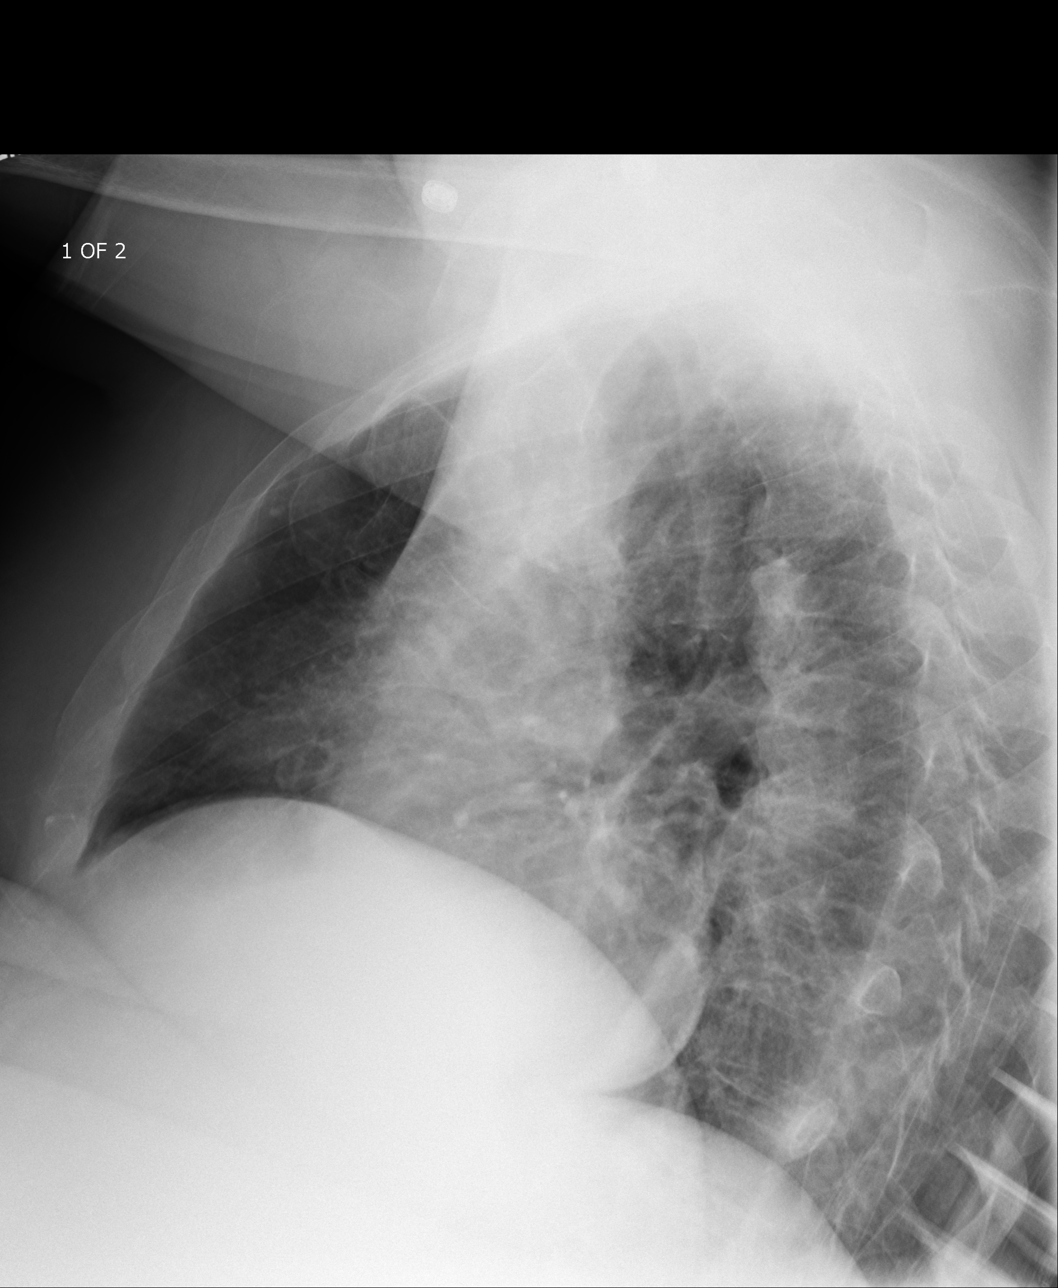
[im 4/4]
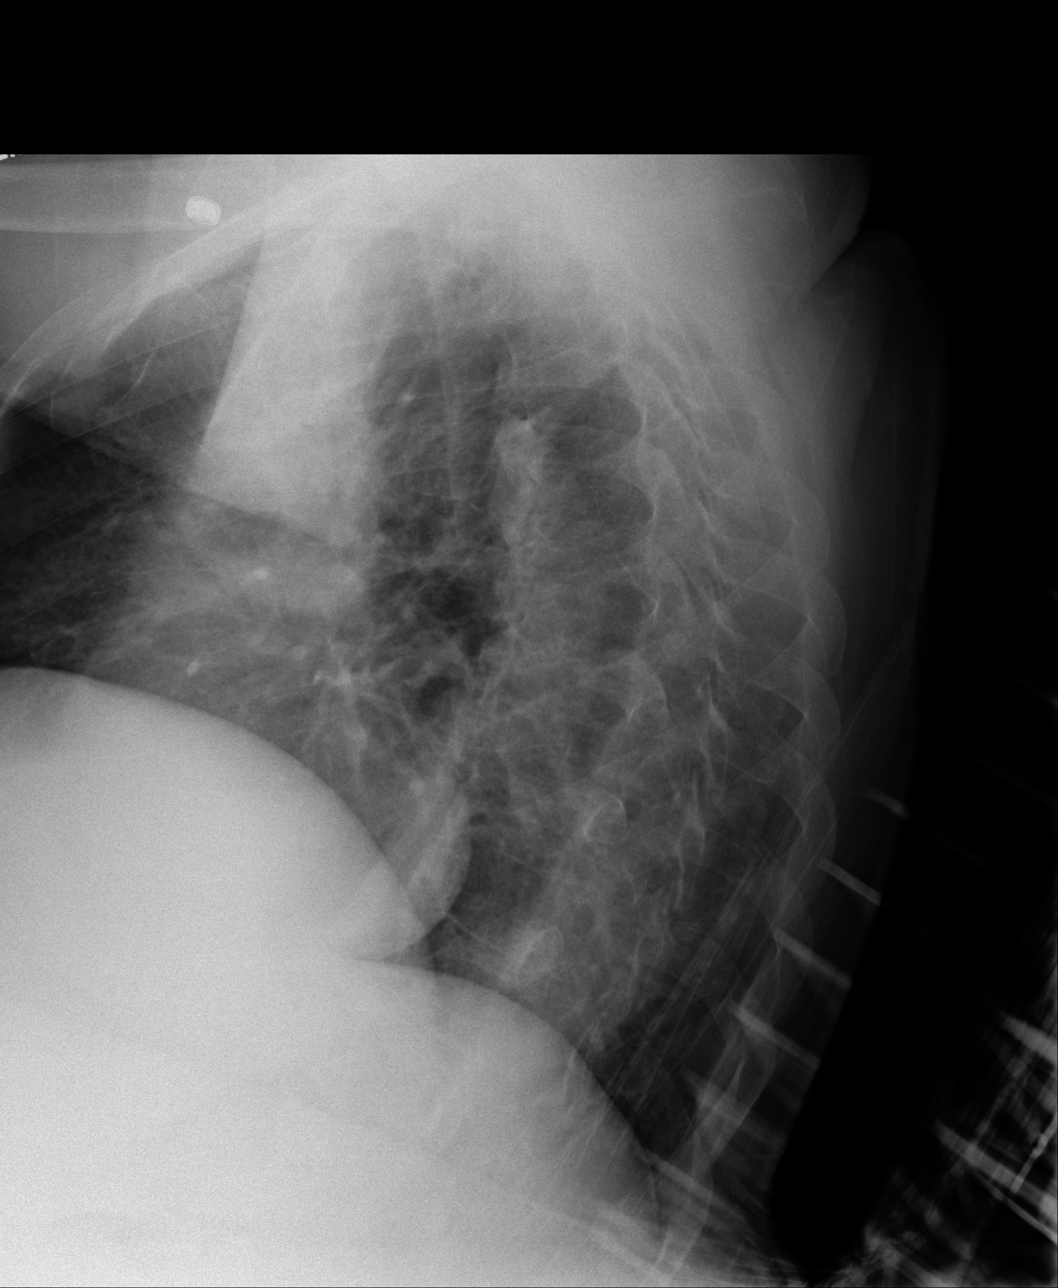

[4 of 4 positions shown; findings below may reference images not displayed]

PROCEDURE:     DXR - DXR CHEST PA (OR AP) AND LATERAL  - October 31, 2012 [DATE]

RESULT:     The patient is rotated toward the left. There is increased
opacity of the right hemithorax which could be secondary to the rotation an
asymmetric overlying soft tissue. There is no significant effusion, definite
consolidation, edema or pneumothorax. There is left ventricular prominence.
No large effusion is evident.
IMPRESSION: Slightly increased density over the right hemithorax
compared to the left which could be secondary to asymmetric soft tissue
density given the rotation of the patient toward the left. No definite
infiltrate or significant effusion. No edema or pneumothorax evident.

[REDACTED]

## 2013-12-31 IMAGING — CR DG CHEST 1V PORT
1 series · 1 of 1 positions shown · non-contrast
Comparison: None.

CLINICAL DATA: Weakness, rule out pneumonia

PORTABLE CHEST - 1 VIEW

[AP]
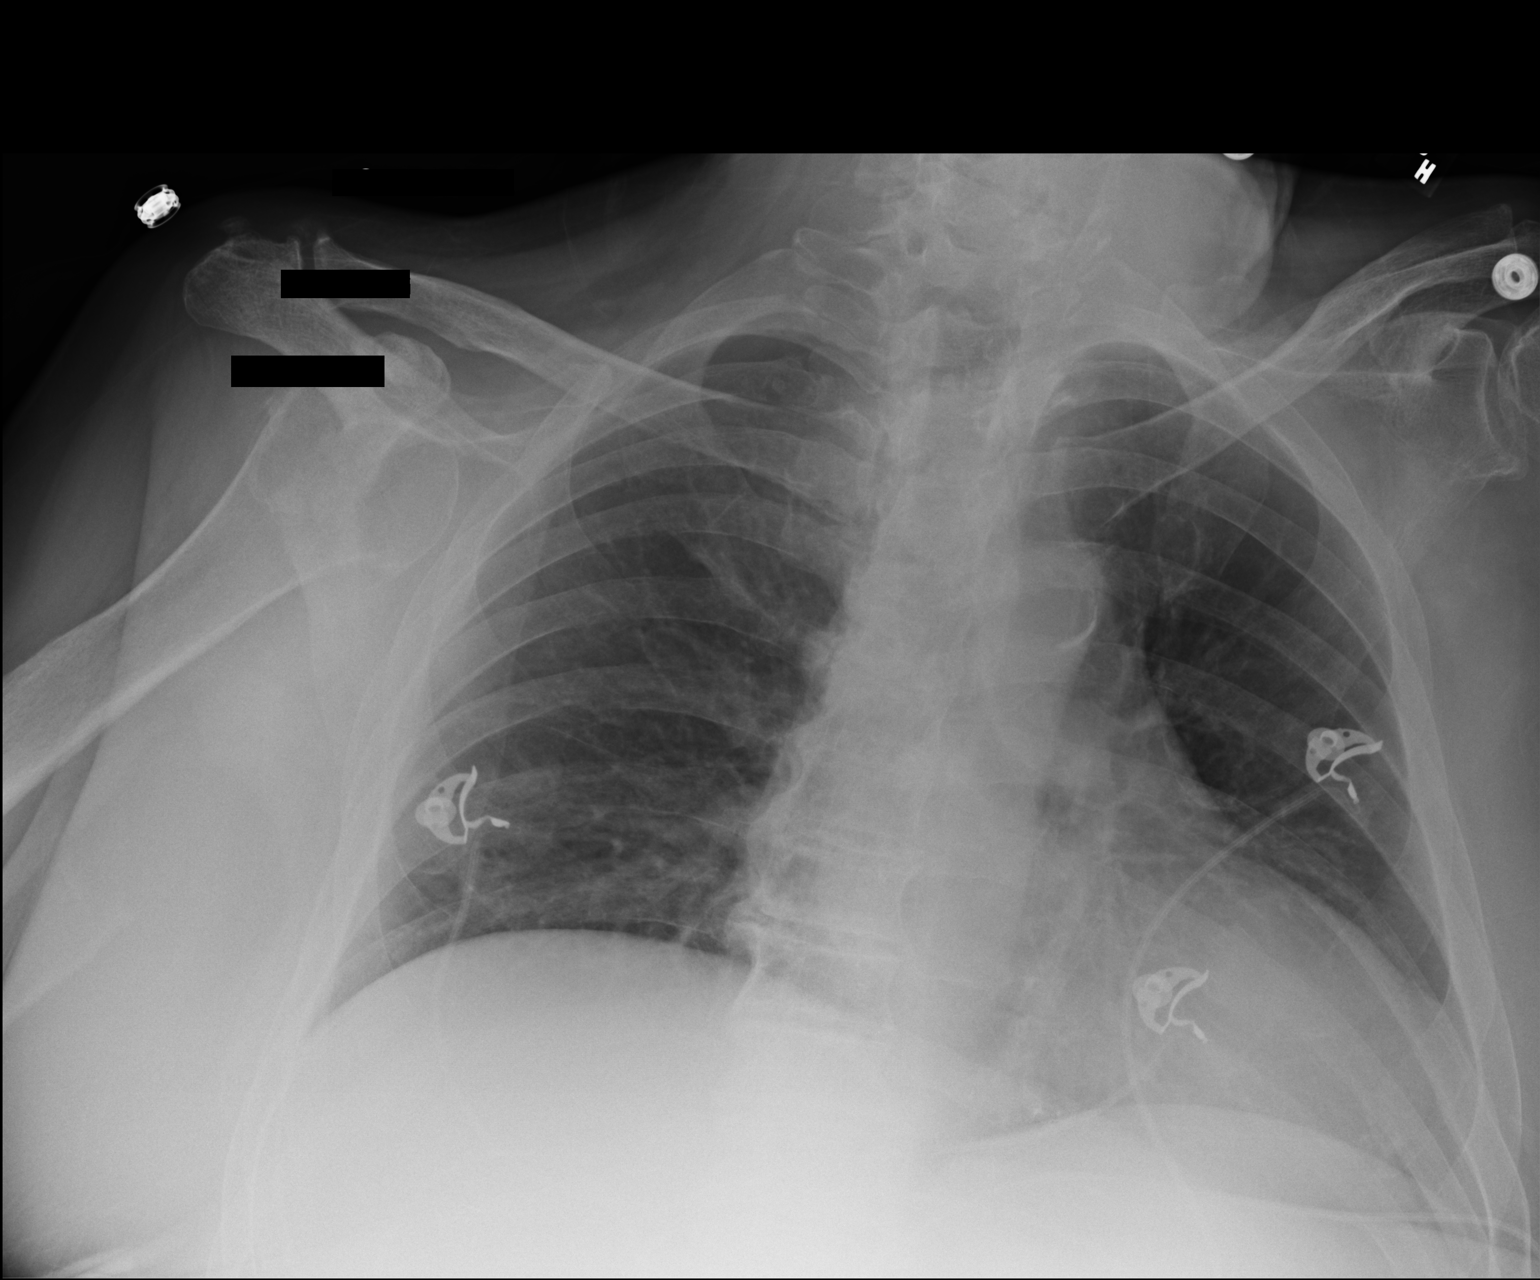

[1 of 1 positions shown; findings below may reference images not displayed]

FINDINGS: Cardiomediastinal silhouette is unremarkable.
Atherosclerotic calcifications of thoracic aorta.  No acute
infiltrate or pleural effusion.  No pulmonary edema.
IMPRESSION: No active disease.

## 2015-03-31 NOTE — Consult Note (Signed)
PATIENT NAME:  Shane [name], Shane [name] MR#:  [id number] DATE OF BIRTH:  [birth year]  DATE OF CONSULTATION:  10/31/2012  REFERRING PHYSICIAN:  Dr. Thelma Barge.  CONSULTING PHYSICIAN:  Gregorio Worley A. Allena Katz, MD  PRIMARY CARE PHYSICIAN:  Dr. Ronna Polio.   REASON FOR CONSULTATION: Medical management.   HISTORY OF PRESENT ILLNESS: Shane [name] is a very pleasant 69 year old Caucasian gentleman with past medical history of multiple sclerosis, history of peripheral vascular disease, hypertension, diabetes, who was admitted to Dr. Thelma Barge' service for nonhealing large necrotic ulcers over the lower abdomen and bilateral lower extremities. The patient has history of severe peripheral vascular disease, underwent lower extremity angioplasty done by vascular surgery, Dr. Wyn Quaker, a few weeks ago and thereafter developing black toes and large ulcers over the bilateral lower extremities and lower part of the abdomen. The patient was seen at [facility]. Skin biopsy showed cholesterol embolization and necrotic ulcers. He was seen as an outpatient for nonhealing necrotic ulcers by Dr. Thelma Barge. The patient's white count is 25,000. Since wounds were not healing, he is admitted for debridement in the OR and possible wound VAC placement.   PAST MEDICAL HISTORY:    1. Multiple sclerosis.  2. Hyperlipidemia.  3. Hypertension.  4. Severe peripheral vascular disease.  5. Type 2 diabetes.  6. Neuropathy.  7. Patient is now bedbound and wheelchair bound due to his chronic medical problems and debility from MS.   PAST SURGICAL HISTORY:  1. Laminectomy.  2. Tonsillectomy.  3. Femoral-femoral bypass graft with multiple subsequent surgical procedures on his lower extremities, which were done endovascularly.  4. Right great toe surgery.   MEDICATIONS:  1. Lipitor 40 mg daily.  2. Percocet as needed.  3. Symmetrel 200 mg twice a day.  4. Amlodipine/benazepril 10/20, one capsule once a day.  5. Aspirin 325 mg p.o. daily.  6. Baclofen 20 mg 3 times a day.   7. Clobetasol cream 3 times a day.  8. Clonidine 0.1 mg b.i.d.  9. Plavix 75 mg daily.  10. Multivitamin as needed.  11. Valium 5 mg every eight hours as needed.  12. Flonase two sprays in each nostril as needed.  13. Neurontin 600 mg 3 times daily.  14. Insulin 70/30, 18 units in the morning and 20 units at bedtime.  15. Metformin 1000 mg b.i.d.  16. Naprosyn 250 mg b.i.d.  17. Prilosec 20 mg daily.  18. Trental CR 400 mg twice daily.   FAMILY HISTORY: Positive for hypertension.   SOCIAL HISTORY: He lives with his wife at home, is bedbound and wheelchair bound. Nonsmoker. Nonalcoholic.   REVIEW OF SYSTEMS: CONSTITUTIONAL: No fever. Positive for fatigue and weakness. EYES: No blurred or double vision. ENT: No tinnitus, ear pain, hearing loss. RESPIRATORY: No cough, wheeze, or hemoptysis. CARDIOVASCULAR: No chest pain, orthopnea, or edema. GASTROINTESTINAL: No nausea, vomiting, diarrhea, abdominal pain, or gastroesophageal reflux disease. GENITOURINARY: No dysuria or hematuria. ENDOCRINE: No polyuria, nocturia, or thyroid problems. HEMATOLOGY: No anemia or easy bruising. SKIN: Positive for ulcers in lower extremity and lower abdomen. MUSCULOSKELETAL: Positive for arthritis. NEUROLOGIC: Positive for multiple sclerosis. PSYCH: No anxiety or depression. All other systems reviewed and negative.   PHYSICAL EXAMINATION:  GENERAL: The patient is awake, alert, oriented x3, not in acute distress.   VITAL SIGNS: Afebrile, pulse 129, blood pressure 137/85, sats 96% on room air.   HEENT: Atraumatic, normocephalic. Pupils are equal, round, and reactive to light and accommodation. Extraocular movements intact. Oral mucosa is moist.   NECK: Supple. No JVD.  No carotid bruit.   LUNGS: Clear to auscultation bilaterally. No rales, rhonchi, respiratory distress, or labored breathing.   CARDIOVASCULAR: Tachycardia present. No murmur heard. PMI not lateralized. Chest is nontender.   EXTREMITIES: Bilateral  nonhealing necrotic ulcers present on both the feet, shin and thigh along with necrotic ulcers present on the lower abdomen. There is surrounding erythema noted. Pulses are feeble in both the lower extremities, however, femoral pulses are palpable. Minimal edema noted.   NEUROLOGIC: The patient has bilateral lower extremity weakness, chronic motor weakness from MS. He has neuropathy from below knee bilaterally.   MOTOR SYSTEM EXAM: The patient has bilateral 3+/5 strength in both the lower extremities. Reflexes are sluggish both the lower extremities. Gait not tested. The patient does not walk.   SKIN: Warm and dry. Ulcers as described above.    PSYCHIATRIC: The patient is awake, alert, oriented x3.   LABORATORY, RADIOLOGICAL AND DIAGNOSTIC DATA: Chest x-ray: No acute cardiopulmonary abnormality. White count 25.7, hemoglobin and hematocrit 11.8 and hematocrit 36.5, platelet count 6.0, glucose 157, BUN 29, creatinine 0.9. Sodium 132, potassium 4.4, chloride 99, bicarbonate 23, calcium 9.6, alkaline phosphatase 173. PT-INR 14.2 and 1.1.   ASSESSMENT: 69 year old Shane [name] with history of severe peripheral vascular disease, hypertension and diabetes admitted with:  1. SIRS secondary to infected bilateral lower extremity and lower abdominal wall necrotic ulcers with history of cholesterol emboli secondary to severe peripheral vascular disease and recent angioplasty in the right lower extremity in October 2013. The patient currently is on broad-spectrum antibiotics, currently on IV vancomycin. Will recommend adding Zosyn as well to cover gram-negative organisms given history of diabetes and polymicrobial infection in the setting of diabetes.  2. Follow blood cultures and CBC.   PLAN:  1. Plan by Dr. Thelma Bargeaks is to take the patient to the OR for debridement of these wounds.  2. Type 2 diabetes. We will continue NovoLog 70/30 and metformin along with sliding scale.  3. Hypertension. Continue clonidine,  amlodipine and benazepril.  4. Multiple sclerosis. The patient is on Symmetrel.   5. Hyperlipidemia. On Lipitor.  6. Severe peripheral vascular disease with recent angioplasty right lower extremity in October 2013.  7. Deep vein thrombosis prophylaxis with subcutaneous heparin.   Thank you for the consult. Will follow while the patient is in house. The above was discussed with the patient and the patient's wife.   TIME SPENT: 50 minutes.   ____________________________ Wylie HailSona A. Allena KatzPatel, MD sap:ap D: 10/31/2012 18:49:07 ET T: 11/01/2012 09:43:02 ET JOB#: [id number]  cc: Sherida Dobkins A. Allena KatzPatel, MD, <Dictator> Ginette PitmanJennifer A. Dan HumphreysWalker, MD Sheppard Plumberimothy E. Thelma Bargeaks, MD Willow OraSONA A Kimiyah Blick MD ELECTRONICALLY SIGNED 11/01/2012 20:12

## 2015-03-31 NOTE — Op Note (Signed)
PATIENT NAME:  Shane [name], Shane [name] MR#:  [id number] DATE OF BIRTH:  1946/12/03  DATE OF PROCEDURE:  11/20/2012  PREOPERATIVE DIAGNOSIS: Multiple groin, thigh, abdominal, and leg wounds.   POSTOPERATIVE DIAGNOSIS: Multiple groin, thigh, abdominal, and leg wounds.   PROCEDURE: Debridement of multiple wounds as below:  1. Right groin 12 x 4 x 0.5 cm.  2. Right anterior thigh 9 x 6 x 0.5 cm.  3. 11 times right leg wounds varying from 0.5 to 1 x 1 to 2 cm. 4. Right inner lower leg 10 x 3 x 0.5 cm. 5. Right outer lower leg 20 x 3 x 0.5 cm. 6. Left abdomen 8 x 10 x 2 cm.  7. Left lower leg 8 x 2 x 0.5 cm. 8. Left upper leg two 1 x 1 x 0.5 cm wounds.  Placement of wound VAC apparatus times one.   ESTIMATED BLOOD LOSS: 50 mL.   ANESTHESIA: General.  SPECIMENS: None.   INDICATION FOR SURGERY: Shane [name] is [name] pleasant 69 year old whom I had seen in the office and had worsening of his wounds, in need of further debridement. He was brought to the operating room for further debridement.   DETAILS OF PROCEDURE:  Shane [name] was brought to the operating room suite. He was laid supine on the operating room table. He was induced, LMA was placed, and general anesthesia was administered. I then took down all his wounds. His wounds had various degrees of necrosis and though not purulence, [name] significant amount of necrotic tissue. Therefore I sharply and using Bovie electrocautery debrided all of his wounds and examined them.  His right buttock and right lateral thigh wounds had eschar and  I elected not to remove that for fear that they would just deepen. I then debrided his left abdomen significantly to clear, clean, bleeding tissue. His right abdominal wound I also debrided to clean bleeding tissue and the remainder of his wounds I debrided sharply to clean bleeding tissue. I then placed [name] wound VAC times two over his lower leg wounds and one of his anterior abdominal wounds, which were clean and shallow and thus  would benefit greatly from [name] wound VAC. I then placed wet to dry on his two abdominal wounds and bacitracin Adaptic to the remainder of his wounds. ABD pads were then placed over the areas where wet to dry was placed and then Kerlix was wrapped around his leg. He was then awakened, extubated, and brought to the postanesthesia care unit. There were no immediate complications. Needle, sponge, and instrument counts were correct at the end of the procedure.    ____________________________ Si Raiderhristopher [name]. Cody Albus, MD cal:bjt D: 11/20/2012 16:42:57 ET T: 11/20/2012 17:18:10 ET JOB#: [id number]  cc: Cristal Deerhristopher [name]. Rosio Weiss, MD, <Dictator> Jarvis NewcomerHRISTOPHER [name] Samanda Buske MD ELECTRONICALLY SIGNED 12/07/2012 10:36

## 2015-03-31 NOTE — Consult Note (Signed)
PATIENT NAME:  Shane [name], Maxxon A MR#:  [id number] DATE OF BIRTH:  1946/06/21  DATE OF CONSULTATION:  11/20/2012  REFERRING PHYSICIAN:  Dr. Juliann PulseLundquist  CONSULTING PHYSICIAN:  Lashona Schaaf P. Juliene PinaMody, MD  PRIMARY CARE PHYSICIAN: Ronna PolioJennifer Walker, MD   REASON FOR CONSULTATION: Medical management.   IMPRESSION: This is a 69 year old male with a history of multiple sclerosis, peripheral vascular disease, hypertension, diabetes, cholesterol emboli after angioplasty status post debridement and Wound VAC placement on 11/20/2012.  1. Diabetes. The patient may resume ADA diet. Sliding scale insulin. Continue his insulin. Hold metformin for now. Follow BMP and glucose levels.  2. Hypertension. Continue his outpatient medications.  3. Multiple sclerosis. The patient is on Symmetrel.  4. Hyperlipidemia. On Lipitor.  5. Severe peripheral vascular disease with recent angioplasty and cholesterol emboli syndrome.  6. Status post debridement and Wound VAC placement.  7. Tachycardia. I recommend some IV fluids. Continue to monitor. Pain control.  HISTORY OF PRESENT ILLNESS: This is a very pleasant 69 year old male who was admitted for Wound VAC placement and debridement. Hospitalist service was consulted for medical management. The patient actually has no complaints and is doing well. He just got back from his surgery.   REVIEW OF SYSTEMS: CONSTITUTIONAL: No fever. No fatigue or weakness. EYES: No blurred or double vision, cataracts or glaucoma. ENT: No tinnitus, ear pain, hearing loss. RESPIRATORY: No cough, wheezing, hemoptysis, COPD. CARDIOVASCULAR: No chest pain, orthopnea, edema, syncope. GI: No nausea, vomiting, diarrhea, or abdominal pain. GU: No dysuria or hematuria. ENDOCRINE: No polyuria, polydipsia, or thyroid problems. HEMATOLOGY: No anemia or easy bruising. SKIN: He has ulcers. Recent Wound VAC. He has ulcers in his lower extremities, lower abdomen. MUSCULOSKELETAL: Positive for arthritis. NEUROLOGIC: Positive MS.  Negative for CVAT. PSYCH: No history of anxiety or depression.    PAST MEDICAL HISTORY:  1. Multiple sclerosis.  2. Hyperlipidemia.  3. Hypertension. 4. Sepsis. 5. Severe peripheral vascular disease with cholesterol emboli syndrome.  6. Type II diabetes.  7. Neuropathy.   PAST SURGICAL HISTORY:  1. History of laminectomy. 2. Tonsillectomy.  3. Femoral-femoral bypass graft with multiple subsequent surgical procedures of lower extremities.  4. Right great toe surgery.   MEDICATIONS:  1. Lipitor 40 mg daily.  2. Percocet as needed.  3. Symmetrel 1000 mg 2 times a day.  4. Norvasc/Benazepril 10/20 mg daily.  5. Aspirin 81 mg daily.  6. Baclofen 20 mg t.i.d.  7. Clobetasol propionate 0.005% cream t.i.d.  8. Clonidine 0.1 mg b.i.d.  9. Plavix 75 mg daily.  10. Multivitamin daily.  11. Valium 5 mg t.i.d. p.r.n.  12. Flonase two sprays nasally.  13. Neurontin 600 mg t.i.d.  14. Insulin 70/30 18 in the morning and 20 units at night.  15. Metformin 1000 mg b.i.d.  16. Naproxen 250 mg b.i.d.  17. Prilosec 20 mg daily.  18. Trental 400 mg b.i.d.   SOCIAL HISTORY: No tobacco, alcohol, or drug use.   FAMILY HISTORY: Positive for hypertension.   PHYSICAL EXAMINATION:   VITAL SIGNS: Temperature 97.6, pulse 112, respirations 22, blood pressure 138/68, 97% on room air.   GENERAL: The patient is alert, oriented not in acute distress.   HEENT: Head is atraumatic. Pupils are round and reactive. Sclerae anicteric. Mucous membranes are moist. Oropharynx is clear.   NECK: Supple. No appreciable enlarged thyroid.   CARDIOVASCULAR: Tachycardia without murmur, gallops, or rubs. PMI is not displaced.   LUNGS: Anteriorly clear to auscultation without crackles, rales, rhonchi, or wheezing.   ABDOMEN: Bowel  sounds are positive. Nontender, nondistended. No hepatomegaly.   EXTREMITIES: He has black eschar toes. His whole legs are wrapped. I was unable to evaluate his buttocks due to  inability to move to the side   NEUROLOGIC: Decreased sensation of his lower extremities. Pulses are hard to palpate. Cranial nerves II through XII are grossly intact.   LABORATORY, DIAGNOSTIC AND RADIOLOGIC DATA: White blood cells 14.8, hemoglobin 12.7, hematocrit 39, platelets 516, sodium 136, potassium 4.0, chloride 104, bicarb 24, BUN 25, creatinine 0.68, glucose 155, calcium 10.0.   Thank you for allowing me to participate in the care of this patient. We will continue to follow.    TIME SPENT: 40 minutes.   ____________________________ Janyth Contes. Juliene Pina, MD spm:drc D: 11/20/2012 18:47:01 ET T: 11/21/2012 08:39:45 ET JOB#: [id number]  cc: Countess Biebel P. Juliene Pina, MD, <Dictator> Janyth Contes Jaxson Keener MD ELECTRONICALLY SIGNED 11/22/2012 13:32

## 2015-03-31 NOTE — Consult Note (Signed)
PCP Dr Kellie ShropshireJen Walker novolog 70/30  cont clonidinw,amlodipine/benazepril symmetrel lipitor large none healing ulceers on bothe LE and bilateral lower abdomenOR for debridement per Dr Thelma Bargeaks PVD with recnet angioplasty right LE  in oct 2013 by dr dew bilaterally on 09/17/2012 pt and wife   Electronic Signatures: Willow OraPatel, Ariyanah Aguado A (MD) (Signed on 20-Nov-13 16:27)  Authored   Last Updated: 20-Nov-13 16:30 by Willow OraPatel, Na Waldrip A (MD)

## 2015-03-31 NOTE — Consult Note (Signed)
Prozac: Other    Impression 69 y/o m with MS, bedbound, chol embol after arteriogram here with s/p debridement of multiple wounds nad wound vac placement we are consulting for medical management    Plan 1. DM: cotn insuliong SSI ADA diet hold metformin while in house  2. PVD 3. HTN 4. s/p debridement and wound vac may need sSNF  thank you will follow   Electronic Signatures: Adrian SaranMody, Adamari Frede (MD)  (Signed 10-Dec-13 18:39)  Authored: Allergies, Impression/Plan   Last Updated: 10-Dec-13 18:39 by Adrian SaranMody, Robi Dewolfe (MD)

## 2015-03-31 NOTE — Discharge Summary (Signed)
PATIENT NAME:  Shane [name], Lorance A MR#:  [id number] DATE OF BIRTH:  1946/05/27  DATE OF ADMISSION:  11/20/2012 DATE OF DISCHARGE:  12/10/2012   BRIEF HISTORY: [name] is a 69 year old gentleman with multiple lower extremities and abdominal wounds felt to be vasculopath. He was debrided on 12/10 under general anesthesia and a wound VAC place. The wound VAC has been changed and he is being transferred to a long-term care facility for further wound care therapy prior to considering any surgical coverage. This plan has been discussed with the patient and the transfer facility and he will be transferred this afternoon to a long-term care facility.   DISCHARGE MEDICATIONS:  1. Amlodipine 10 mg p.o. daily.  2. Benazepril 20 mg p.o. daily. 3. Aspirin 325 mg p.o. daily.  4. Bactrim 800 mg/160 mg p.o. b.i.d.  5. Clonidine 0.1 mg p.o. twice a day. 6. Flonase 50 mcg two sprays once a day. 7. Gabapentin 600 mg t.i.d.  8. Lipitor 40 mg p.o. daily.  9. Metformin 1000 mg one b.i.d.  10. Naprosyn 250 mg p.o. b.i.d.  11. NovoLog 70/30 subcutaneous insulin 18 units in the morning and 20 units in the evening. 12. Omeprazole 20 mg p.o. daily.  13. Percocet 5/325, one to two tabs every four hours as needed.  14. Plavix 75 milligrams p.o. daily.  15. Symmetrel 200 mg b.i.d.  16. Trental 400 mg p.o. daily.  17. Valium 5 mg p.o. q.8 hours p.r.n.   FINAL DISCHARGE DIAGNOSIS: Multiple trunk and lower extremity wounds.   SURGERY: Wound debridement and wound VAC placement.    PRIMARY CARE PHYSICIAN:  Dr. Ronna PolioJennifer Walker.    ____________________________ Carmie Endalph L. Ely III, MD rle:ap D: 11/11/2012 13:05:02 ET T: 12/03/2012 13:14:20 ET JOB#: [id number]  cc: Carmie Endalph L. Ely III, MD, <Dictator> Ginette PitmanJennifer A. Dan HumphreysWalker, MD Quentin OreALPH L ELY MD ELECTRONICALLY SIGNED 11/12/2012 17:00

## 2015-03-31 NOTE — Discharge Summary (Signed)
PATIENT NAME:  Shane [name], Shane [name] MR#:  [id number] DATE OF BIRTH:  July 01, 1946  DATE OF ADMISSION:  10/31/2012 DATE OF DISCHARGE:  11/05/2012  ATTENDING PHYSICIAN: Ida Roguehristopher Yoshie Kosel, MD  DISCHARGE DIAGNOSES:  1. Multiple extremity, buttock, and abdominal ulcers likely secondary to showering of atherosclerotic emboli, status post incision and drainage and debridement of wounds on 11/01/2012 with placement of wound VAC.  2. History of femoral-femoral bypass graft with multiple endovascular lower extremity procedures.  3. History of right great toe operation.  4. History of laminectomy.  5. History of tonsillectomy.   DISCHARGE MEDICATIONS:  1. Lipitor 40 mg p.o. daily.  2. Percocet 1 to 2 tabs p.o. every 4 hours p.r.n. pain. 3. Symmetrel 200 mg p.o. twice [name] day.  4. Amlodipine/benazepril 10/20 mg/capsule once [name] day.  5. Aspirin 325 mg daily. 6. Baclofen 20 mg three times daily.  7. Clobetasol cream three times daily.  8. Clonidine 0.1 mg twice [name] day.  9. Plavix 75 mg p.o. daily.  10. Multivitamin p.r.n.  11. Valium 5 mg p.o. every eight hours p.r.n.  12. Flonase two sprays to each nostril p.r.n.  13. Neurontin 600 mg p.o. three times daily.  14. Insulin 18 units in the morning, 20 units at bedtime of NovoLog 70/30. 15. Metformin 1000 mg twice [name] day.  16. Naprosyn 250 mg p.o. twice [name] day.  17. Prilosec 20 mg p.o. daily.  18. Trental CR 400 mg p.o. twice [name] day.   HOSPITAL COURSE: Shane [name] was referred to me from Dr. Thelma Bargeaks, in the [facility]. He was noted to have multiple ulcers which were in need of debridement as well as [name] right lower quadrant area of induration, erythema, and tenderness, likely an abscess. He was admitted. Internal Medicine was consulted for assistance with nonsurgical issues. He went to the operating room on 11/01/2012 and had debridement. Wound VACs were placed over the majority of his wounds at that time and he underwent bacitracin Adaptic to his other wounds.  He returned to the OR on 11/03/2012 for further evaluation and underwent further subsequent debridement and replacement of VAC. On 11/05/2012, he underwent VAC change at bedside and was subsequently discharged to home on 11/05/2012 in satisfactory condition with much improvement in his wounds.   DISCHARGE INSTRUCTIONS: Shane [name] is to undergo at least twice [name] day wound VAC changes at home. He will follow up with me and subsequently in the Wound Care Clinic. He is to have bacitracin Adaptic placed over his other wounds. I have discussed this with him and he is in agreement with this plan. He is to call or return to the ED if he develops increased pain, nausea, vomiting, fever, redness, or drainage from the wounds.  ____________________________ Si Raiderhristopher [name]. Janari Yamada, MD cal:slb D: 11/14/2012 05:31:07 ET T: 11/14/2012 09:06:31 ET JOB#: [id number]  cc: Cristal Deerhristopher [name]. Darey Hershberger, MD, <Dictator> Jarvis NewcomerHRISTOPHER [name] Ranard Harte MD ELECTRONICALLY SIGNED 11/14/2012 20:41

## 2015-03-31 NOTE — Op Note (Signed)
PATIENT NAME:  Shane [name], Orlander A MR#:  [id number] DATE OF BIRTH:  August 31, 1946  DATE OF PROCEDURE:  11/01/2012  ATTENDING PHYSICIAN: Cristal Deerhristopher A. Grasiela Jonsson, MD  PREOPERATIVE DIAGNOSIS: History of embolic events to the extremities and abdominal wall status post angiography and necrosis needing wound debridement.   POSTOPERATIVE DIAGNOSES:  1. History of embolic events to the extremities and abdominal wall status post angiography and necrosis needing wound debridement.  2. Left abdominal abscess requiring incision and debridement.   PROCEDURES PERFORMED: Excision of multiple abdominal wounds. Left abdominal abscess measuring 10 cm x 6.5 cm x 2 cm, right abdominal abscess measuring 2.5 x 6.5 x 0.5, right upper leg 6 x 6.5 x 0.5 cm abscess right medial thigh; approximately 10 wounds all approximately 1 x 1 right lateral calf 21 cm x 3 cm x 0.5 cm. Right medial calf 10 cm x 2 cm x 0.5 cm. Left medial thigh approximately four wounds all 1 x 1. Left lateral leg 7 x 2 x 0.5 cm. Right buttock 6 x 5 cm with placement of wound VAC over left abdomen. Left abdominal wound, right buttock wound, right abdominal and leg wound and right leg wound x2. Total of four VAC sponges, six wounds and two wound VACs were placed.   ESTIMATED BLOOD LOSS: 50 mL.   ANESTHESIA: General.   SPECIMENS: Culture from left abdominal wall wound.   HISTORY OF PRESENT ILLNESS: [name] is an unfortunate 69 year old male with history of showering of emboli resulting in multiple areas of necrotic tissue following angiography. His wounds were necrotic and foul and in one situation with an actual infected wound with necrotic tissue. He was felt to be in need of operative debridement and placement of wound VACs.   DETAILS OF PROCEDURE: Informed consent was obtained. He was admitted for antibiotics prior to Operating Room. I thought that his wounds are too excessive to be debrided on the floor. He was brought to the Operating Room suite. He  was induced, LMA was placed and anesthesia was administered. Has legs and abdomen was prepped in standard surgical fashion. All of the above-mentioned wounds were debrided down to clean tissue and fat. Most were superficial, however, one in his buttocks particularly one in his left abdomen happened to be a bit deeper. One in the left abdomen was necrotic with purulence. Once these were all cleaned up wound VAC was placed over six of the wounds total for a total of 4, two were bridged to go under one track pad and then the four track pads were Y connected together for two VACs and then bacitracin covered Adaptic was placed over the smaller wounds that weren't VAC'd. His leg was then wrapped with Kerlix and had to be taken down and rewrapped after he urinated on his dressings prior to leaving the Operating Room. He was then awakened, had his LMA removed and brought to the postanesthesia care unit. There were no immediate complications. Needle, sponge, and instrument counts were correct at the end of the procedure.   ____________________________ Si Raiderhristopher A. Diavian Furgason, MD cal:cms D: 11/01/2012 16:07:10 ET T: 11/01/2012 17:22:08 ET JOB#: [id number]  cc: Cristal Deerhristopher A. Denni France, MD, <Dictator> Jarvis NewcomerHRISTOPHER A Nonie Lochner MD ELECTRONICALLY SIGNED 11/05/2012 8:14

## 2015-03-31 NOTE — Op Note (Signed)
PATIENT NAME:  Shane [name], Shane [name] MR#:  [id number] DATE OF BIRTH:  04/07/1946  DATE OF PROCEDURE:  11/03/2012  PREOPERATIVE DIAGNOSIS: Multiple leg wounds secondary to emboli status post previous incision and drainage and wound VAC placement.   POSTOPERATIVE DIAGNOSIS: Multiple leg wounds secondary to emboli status post previous incision and drainage and wound VAC placement.   PROCEDURE PERFORMED: Removal of negative suction device, debridement of multiple left abdominal, right buttocks, and leg wounds and placement of four wound VAC devices.    Wounds treated: Left abdominal abscess measuring 10 cm x 6.5 cm x 2 cm Right abdominal abscess measuring 2.5 x 6.5 x 0.5 Right upper leg 6 x 6.5 x 0.5 cm abscess right medial thigh;  Right legs Approximately 10 wounds all approximately 1 x 1  Right lateral calf 21 cm x 3 cm x 0.5 cm.  Right medial calf 10 cm x 2 cm x 0.5 cm.  Left medial thigh approximately four wounds all 1 x 1.  Left lateral leg 7 x 2 x 0.5 cm.  Right buttock 6 x 5 cm with placement of wound VAC over left abdomen. Left abdominal wound, right buttock wound, right abdominal and leg wound and right leg wound x2. Total of four VAC sponges, six wounds and two wound VACs were placed.   SURGEON: Shane Roguehristopher Oliviana Mcgahee, MD  ANESTHESIA: General.   ESTIMATED BLOOD LOSS: 20 mL.   COMPLICATIONS: None.   INDICATION FOR SURGERY: [name] is [name] pleasant 69 year old male who was admitted for multiple wounds requiring operative debridement and wound VAC placement. He last went to the operating room two days ago. I felt that his wounds were likely still in need of debridement and thus brought him back to the operating room suite.   DETAILS OF PROCEDURE: [name] was brought to the operating room suite. He was laid supine on the operating room table. He was induced, endotracheal tube was placed, and general anesthesia was administered. All his vacs were taken down. All of his wounds were  examined. The left abdominal wound still required some further debridement as well as the right side abdominal wound. His right buttocks wound also required debridement. The rest of the small wounds were debrided down to healthy bleeding tissue. Following this four wound vac apparatus were placed, one was placed in his right lower extremity, the tube was below the knee, one was placed bridging the right abdominal and right upper leg wound, those two different vac sponges were then wired together, one vacuum device. The left abdominal and right buttocks wounds were then both covered with vac apparatus and vac sponges were then wired together into [name] common vac apparatus. Then we placed bacitracin Adaptic over the remaining small wounds. He was then awoken, extubated, and brought to the postanesthesia care unit. There were no immediate complications. Needle, sponge, and instrument counts were correct at the end of the procedure.  ____________________________ Shane [name]. Shane Hornbaker, MD cal:slb D: 11/03/2012 14:44:57 ET T: 11/04/2012 11:45:43 ET JOB#: [id number]  cc: Shane [name]. Shane Armijo, MD, <Dictator> Shane [name] Shane Livermore MD ELECTRONICALLY SIGNED 11/05/2012 8:17

## 2015-03-31 NOTE — H&P (Signed)
PATIENT NAME:  Shane [name], Shane [name] MR#:  [id number] DATE OF BIRTH:  Sep 28, 1946  DATE OF ADMISSION:  10/31/2012  ADMITTING DIAGNOSIS: Bilateral lower extremity necrotic ulcers.   HISTORY OF PRESENT ILLNESS: [name] is [name] 69 year old gentleman who carries [name] diagnosis of multiple sclerosis since [id number]. In addition, he has severe peripheral vascular disease and underwent [name] femoral[facility]femoral [facility]. Subsequent to that, he has had multiple surgical procedures, on his lower extremities, to improve blood supply and in October of this year underwent an endovascular approach, from the left brachial artery. This was performed on 09/17/2012 and he developed evidence of cholesterol embolization to his lower extremities by 09/20/2012. He was seen by Dr. Ronna PolioJennifer [name] also by Dr. Festus BarrenJason [name] who [facility]. He [facility] [name] [facility] which returned cholesterol embolization. He was subsequently discharged from [facility] where he has been treated with hyperbaric oxygen therapy and, in addition, has undergone wound care. Unfortunately, his wounds have failed to respond adequately to debridement and treatment and he is now admitted with infected bilateral lower extremity wounds.   MEDICATIONS:  1. Lipitor 40 mg once daily. 2. Percocet as needed.  3. Symmetrel 200 mg twice [name] day. 4. Amlodipine/benazepril 10/20 mg capsule once per day. 5. Aspirin 325 mg once [name] day. 6. Baclofen 20 mg three times daily.  7. Clobetasol cream three times daily. 8. Clonidine 0.1 mg twice [name] day. 9. Plavix 75 mg daily.  10. Multivitamins as needed.  11. Valium 5 mg every eight hours as needed. 12. Flonase two sprays in each nostril as needed.  13. Neurontin 600 mg three times daily. 14. Insulin 18 units in the morning and 20 units at night of NovoLog mixture 70/30. 15. Metformin 1000 mg twice [name] day. 16. Naprosyn 250 mg  twice [name] day.  17. Prilosec 20 mg once [name] day. 18. Trental CR 400 mg tablet twice [name] day.  DRUG ALLERGIES: No known drug allergies, although he does have severe reactions to Prozac which caused hallucinations and suicidal ideations.   PAST MEDICAL HISTORY:  1. Laminectomy in the past.  2. Femoral-femoral bypass graft with multiple subsequent surgical procedures on his lower extremities which were performed endo-vascularly.  3. Right great toe operation. 4. Tonsillectomy.   SOCIAL HISTORY: He is married with two children. He smokes half-pack of cigarettes [name] day. He worked in [employer] but is now retired. He also is [name] Cytogeneticistveteran.   FAMILY HISTORY: His mother died at the age of 70 of [name] brain tumor and his father died of [name] stroke and heart attack at the age of 70.   REVIEW OF SYSTEMS: [name] complete review of systems was asked and was as per history of present illness unless otherwise stated.   PHYSICAL EXAMINATION:   GENERAL: Examination revealed an elderly gentleman who appeared his stated age.   HEENT: Head is normocephalic. The pupils are equal. The oropharynx had moist mucous membranes.   NECK: Supple without thyromegaly or adenopathy.   PULMONARY: Lungs were equal and clear bilaterally.   CARDIOVASCULAR: Heart was regular.   ABDOMEN: Soft, nontender, and nondistended. There were no palpable masses.   EXTREMITIES: No clubbing.   SKIN: Descriptions of his wounds are well outlined in the outpatient wound care chart, but these basically include [name] large area on his left lower quadrant which is [name] necrotic eschar covered place. There  is another area in the right groin which is eschar covered. The left lower quadrant measures approximately 8 cm and the groin measures 5 centimeter. There are multiple other wounds present on the lower extremities. On his right buttock and right thigh, there are large 8 cm necrotic ulcers. There are [name] multitude of other ulcers scattered on the legs  bilaterally.   ASSESSMENT AND PLAN: This patient has severe bilateral lower extremity necrotic ulcers with local wound infection. I believe that he has failed outpatient management and now requires aggressive surgical debridement with probable wound VAC placement. We will ask our hospitalist to assist [place] in management. The plan is to take him to the operating room tomorrow for surgical debridement. He will require intensive wound care management while an inpatient and can be followed up as an outpatient if needed.  ____________________________ Shane Plumber Thelma Barge, MD teo:slb D: 10/31/2012 09:57:04 ET T: 10/31/2012 10:31:17 ET JOB#: [id number]  cc: Shane Pacas E. Thelma Barge, MD, <Dictator> Shane December MD ELECTRONICALLY SIGNED 11/11/2012 6:18

## 2015-03-31 NOTE — Op Note (Signed)
PATIENT NAME:  Shane [name], Shane [name] MR#:  [id number] DATE OF BIRTH:  26-Aug-1946  DATE OF PROCEDURE:  09/10/2012  PREOPERATIVE DIAGNOSES:  1. Peripheral artery disease with rest pain, right lower extremity.  2. Diabetes.  3. Hypertension.  4. Status post previous femoral to femoral bypass.   POSTOPERATIVE DIAGNOSES: 1. Peripheral artery disease with rest pain, right lower extremity.  2. Diabetes.  3. Hypertension.  4. Status post previous femoral to femoral bypass.   PROCEDURE: 1. Ultrasound guidance for vascular access, left radial artery.  2. Catheter placement into right popliteal artery from left radial approach.  3. Aortogram and selective right lower extremity angiogram.  4. Percutaneous transluminal angioplasty of right common iliac artery stenosis with an 8-mm diameter angioplasty balloon.  5. Percutaneous transluminal angioplasty of right superficial femoral artery and popliteal artery stenosis with 4-mm diameter angioplasty balloon.   SURGEON: Annice Needy, M.D.   ANESTHESIA: Local with moderate conscious sedation.   ESTIMATED BLOOD LOSS: Approximately 75 mL.   FLUOROSCOPY TIME: 13 minutes.  CONTRAST USED: 95 mL Visipaque.   INDICATION FOR PROCEDURE: This is a gentleman whom we saw for peripheral arterial disease. He has developed pain in his right foot at rest.  He has had a previous ulceration which did heal.  His noninvasive study showed markedly diminished flow with monophasic flow from the iliacs down. He had areas of stenosis in the SFA and popliteal. The iliacs were hard to evaluate but monophasic flow certainly suggests a significant stenosis proximal to his femorofemoral bypass, threatening this. With his critical limb symptoms, he is brought in for evaluation with angiography and possible revascularization. Due to his previous femoral-femoral bypass, a left arm approach was planned. The risks and benefits were discussed. Informed consent was obtained.   DESCRIPTION OF  PROCEDURE: The patient was brought to the vascular interventional radiology suite. The left upper extremity was sterilely prepped and draped and a sterile surgical field was created. The left radial artery was visualized with ultrasound and found to be patent. It was then accessed under direct ultrasound guidance without difficulty with a Seldinger needle. A micropuncture needle, micropuncture wire, and sheath were then placed. We were able to traverse down the descending thoracic aorta without difficulty and an aortogram was performed through a pigtail catheter and the pelvic obliques performed. This demonstrated a somewhat ectatic common iliac artery on the right. The left iliac was occluded, which was expected. There was a moderate to high-grade stenosis in the proximal portion of the right common iliac artery. The iliac artery below this did appear patent. He had a normal femoral bifurcation with a patent femoral-femoral bypass. His SFA was normal proximally but in the mid to distal segment there was a high-grade near-occlusive stenosis, and again in the popliteal artery just above the level of the knee there was a high-grade near occlusive stenosis. We were unable to image all the way to his foot, but he appeared to have one-vessel runoff to the mid calf. The patient was heparinized. A 6 French shuttle sheath was placed over a Terumo advantage wire. I was able to cross the iliac lesion without significant difficulty and advanced a 135 diagnostic catheter into the superficial femoral artery. A 0.014 wire was then exchanged for and able to cross the SFA and popliteal stenosis without any difficulty. The only balloons we had that would reach that lesion were 170 working length balloons which were rapid exchange, and the largest balloon we had was 4. This matched the popliteal  artery and was on the small side for the SFA, but still reasonable. We then took the 4-mm diameter x 20-cm length angioplasty balloon down  to the level of the knee, inflated this throughout the above-knee popliteal artery and the distal superficial femoral artery. Two areas of tight waists were taken which resolved with angioplasty. Due to a kink in the wire we had to remove the wire and balloon as well as the sheath, and we were able to buddy wire with an 0.035 wire to save our access, and a short 6 French sheath was then replaced. Through this, the Terumo advantage wire was used to recross the iliac lesion without difficulty with the 135 catheter. An 8-mm diameter angioplasty balloon was inflated, again a waist was seen in the iliac artery and completion angiogram now showed this area still to have some mild stenosis but this appeared to be less than 30%. Imaging with the diagnostic catheter showed the SFA and popliteal interventions to have non-flow-limiting stenosis in the 20% to 30% range in the popliteal artery and in the 30 to 40% range in the SFA. We did not have stents that would have treated that far down anyway, and we had significantly improved his flow at this point it appeared. A 4-0 Monocryl pursestring suture was placed around the sheath exit site. The sheath was removed. Pressure was held and a sterile pressure dressing was placed. The patient tolerated the procedure well and was taken to the recovery room in stable condition.    ____________________________ Annice NeedyJason S. Saleem Coccia, MD jsd:bjt D: 09/10/2012 16:28:52 ET T: 09/10/2012 17:22:36 ET JOB#: [id number]  cc: Annice NeedyJason S. Marysue Fait, MD, <Dictator> Annice NeedyJASON S Chalsea Darko MD ELECTRONICALLY SIGNED 09/11/2012 14:00
